# Patient Record
Sex: Female | Born: 1975
Health system: Southern US, Community
[De-identification: ages and names within clinical notes are randomized; demographics above are authoritative.]

---

## 2006-09-16 ENCOUNTER — Ambulatory Visit: Payer: Self-pay | Admitting: Gastroenterology

## 2006-09-16 LAB — CONVERTED CEMR LAB
ALT: 14 units/L (ref 0–40)
Albumin: 4 g/dL (ref 3.5–5.2)
Basophils Absolute: 0 10*3/uL (ref 0.0–0.1)
Calcium: 9.4 mg/dL (ref 8.4–10.5)
Creatinine, Ser: 0.6 mg/dL (ref 0.4–1.2)
Glomerular Filtration Rate, Af Am: 152 mL/min/{1.73_m2}
HCT: 38.9 % (ref 36.0–46.0)
MCHC: 33.9 g/dL (ref 30.0–36.0)
MCV: 88.4 fL (ref 78.0–100.0)
Monocytes Relative: 10.1 % (ref 3.0–11.0)
RDW: 13.7 % (ref 11.5–14.6)
Sodium: 136 meq/L (ref 135–145)
T4, Total: 7.9 ug/dL (ref 5.0–12.5)
Total Bilirubin: 0.7 mg/dL (ref 0.3–1.2)
Total Protein: 7 g/dL (ref 6.0–8.3)
WBC: 6.5 10*3/uL (ref 4.5–10.5)

## 2007-05-11 ENCOUNTER — Inpatient Hospital Stay (HOSPITAL_COMMUNITY): Admission: AD | Admit: 2007-05-11 | Discharge: 2007-05-13 | Payer: Self-pay | Admitting: Obstetrics & Gynecology

## 2009-08-12 ENCOUNTER — Ambulatory Visit: Payer: Self-pay | Admitting: Internal Medicine

## 2009-08-12 DIAGNOSIS — M5412 Radiculopathy, cervical region: Secondary | ICD-10-CM | POA: Insufficient documentation

## 2009-08-12 DIAGNOSIS — M542 Cervicalgia: Secondary | ICD-10-CM | POA: Insufficient documentation

## 2009-08-17 ENCOUNTER — Ambulatory Visit (HOSPITAL_COMMUNITY): Admission: RE | Admit: 2009-08-17 | Discharge: 2009-08-17 | Payer: Self-pay | Admitting: Internal Medicine

## 2009-08-19 ENCOUNTER — Encounter: Payer: Self-pay | Admitting: Internal Medicine

## 2009-08-19 ENCOUNTER — Telehealth: Payer: Self-pay | Admitting: Internal Medicine

## 2009-08-19 DIAGNOSIS — M4802 Spinal stenosis, cervical region: Secondary | ICD-10-CM | POA: Insufficient documentation

## 2009-08-25 ENCOUNTER — Telehealth: Payer: Self-pay | Admitting: Internal Medicine

## 2009-09-09 ENCOUNTER — Telehealth: Payer: Self-pay | Admitting: Internal Medicine

## 2009-12-30 ENCOUNTER — Ambulatory Visit: Payer: Self-pay | Admitting: Internal Medicine

## 2009-12-30 DIAGNOSIS — H6120 Impacted cerumen, unspecified ear: Secondary | ICD-10-CM | POA: Insufficient documentation

## 2009-12-30 DIAGNOSIS — H68109 Unspecified obstruction of Eustachian tube, unspecified ear: Secondary | ICD-10-CM | POA: Insufficient documentation

## 2009-12-30 DIAGNOSIS — H6500 Acute serous otitis media, unspecified ear: Secondary | ICD-10-CM | POA: Insufficient documentation

## 2010-01-31 ENCOUNTER — Telehealth: Payer: Self-pay | Admitting: Internal Medicine

## 2010-02-16 ENCOUNTER — Encounter: Payer: Self-pay | Admitting: Internal Medicine

## 2010-03-14 ENCOUNTER — Telehealth: Payer: Self-pay | Admitting: Nurse Practitioner

## 2010-03-15 ENCOUNTER — Ambulatory Visit: Payer: Self-pay | Admitting: Gastroenterology

## 2010-03-15 DIAGNOSIS — R110 Nausea: Secondary | ICD-10-CM | POA: Insufficient documentation

## 2010-03-15 DIAGNOSIS — J029 Acute pharyngitis, unspecified: Secondary | ICD-10-CM | POA: Insufficient documentation

## 2010-03-15 DIAGNOSIS — R1011 Right upper quadrant pain: Secondary | ICD-10-CM | POA: Insufficient documentation

## 2010-03-16 LAB — CONVERTED CEMR LAB
ALT: 15 units/L (ref 0–35)
AST: 20 units/L (ref 0–37)
Albumin: 4.3 g/dL (ref 3.5–5.2)
Basophils Absolute: 0 10*3/uL (ref 0.0–0.1)
CO2: 25 meq/L (ref 19–32)
Chloride: 107 meq/L (ref 96–112)
Eosinophils Absolute: 0.1 10*3/uL (ref 0.0–0.7)
Eosinophils Relative: 0.9 % (ref 0.0–5.0)
GFR calc non Af Amer: 111.29 mL/min (ref >60)
HCT: 30.8 % — ABNORMAL LOW (ref 36.0–46.0)
Lymphocytes Relative: 31.2 % (ref 12.0–46.0)
Lymphs Abs: 2.4 10*3/uL (ref 0.7–4.0)
MCV: 77.4 fL — ABNORMAL LOW (ref 78.0–100.0)
Monocytes Absolute: 0.7 10*3/uL (ref 0.1–1.0)
Monocytes Relative: 9.6 % (ref 3.0–12.0)
Neutro Abs: 4.5 10*3/uL (ref 1.4–7.7)
Neutrophils Relative %: 57.9 % (ref 43.0–77.0)
Platelets: 329 10*3/uL (ref 150.0–400.0)
RDW: 15.2 % — ABNORMAL HIGH (ref 11.5–14.6)
TSH: 1.39 microintl units/mL (ref 0.35–5.50)

## 2010-03-21 ENCOUNTER — Ambulatory Visit: Payer: Self-pay | Admitting: Nurse Practitioner

## 2010-03-21 ENCOUNTER — Ambulatory Visit (HOSPITAL_COMMUNITY): Admission: RE | Admit: 2010-03-21 | Discharge: 2010-03-21 | Payer: Self-pay | Admitting: Gastroenterology

## 2010-03-29 LAB — CONVERTED CEMR LAB
Ferritin: 2.1 ng/mL — ABNORMAL LOW (ref 10.0–291.0)
Folate: 13.2 ng/mL
Saturation Ratios: 4.7 % — ABNORMAL LOW (ref 20.0–50.0)
Vitamin B-12: 225 pg/mL (ref 211–911)

## 2010-10-03 NOTE — Progress Notes (Signed)
Summary: REFERRAL  Phone Note Call from Patient Call back at Home Phone [phone] Call back at [phone]   Summary of Call: Patient is requesting a call.  Initial call taken by: Lamar Sprinkles, CMA,  September 09, 2009 2:38 PM  Follow-up for Phone Call        Pt informed that we are waiting on referral time Follow-up by: Lamar Sprinkles, CMA,  September 09, 2009 5:15 PM

## 2010-10-03 NOTE — Consult Note (Signed)
Summary: [facility]  [facility]   Imported By: Sherian Rein 02/20/2010 07:33:51  _____________________________________________________________________  External Attachment:    Type:   Image     Comment:   External Document

## 2010-10-03 NOTE — Assessment & Plan Note (Signed)
Summary: EAR IS STOPPED UP/NWS   Vital Signs:  Patient profile:   35 year old female Menstrual status:  regular Height:      65 inches Weight:      151 pounds BMI:     25.22 O2 Sat:      99 % on Room air Temp:     98.3 degrees F oral Pulse rate:   86 / minute Pulse rhythm:   regular Resp:     16 per minute BP sitting:   134 / 90  (left arm) Cuff size:   large  Vitals Entered By: Rock Nephew CMA (December 30, 2009 8:39 AM)  Nutrition Counseling: Patient's BMI is greater than 25 and therefore counseled on weight management options.  O2 Flow:  Room air CC: L side ear congestion x 3wks Is Patient Diabetic? No Pain Assessment Patient in pain? no        Primary Care Provider:  Etta Grandchild MD  CC:  L side ear congestion x 3wks.  History of Present Illness: She returns c/o 3 week hx. of left LOH, fullnees in left ear, and "static" sensation in left ear.  Preventive Screening-Counseling & Management  Alcohol-Tobacco     Alcohol drinks/day: 0     Smoking Status: never  Hep-HIV-STD-Contraception     Hepatitis Risk: no risk noted     HIV Risk: no risk noted     STD Risk: no risk noted      Sexual History:  currently monogamous.        Drug Use:  never.        Blood Transfusions:  no.    Medications Prior to Update: 1)  Birth Control Pill 2)  Ibuprofen 800 Mg Tabs (Ibuprofen) .... One By Mouth Three Times A Day As Needed For Pain  Current Medications (verified): 1)  Ibuprofen 800 Mg Tabs (Ibuprofen) .... One By Mouth Three Times A Day As Needed For Pain 2)  Ceftin 500 Mg Tab (Cefuroxime Axetil) .... Take One (1) Tablet By Mouth Two (2) Times A Day X 10 Days  Allergies (verified): No Known Drug Allergies  Past History:  Past Medical History: Reviewed history from 08/12/2009 and no changes required. Unremarkable  Past Surgical History: Reviewed history from 08/12/2009 and no changes required. Denies surgical history  Family History: Reviewed history  from 08/12/2009 and no changes required. Family History of Alcoholism/Addiction Family History of Arthritis Family History High cholesterol Family History Hypertension Family History Kidney disease Family History of Stroke F 1st degree relative <60  Social History: Reviewed history from 08/12/2009 and no changes required. Occupation: Editor, commissioning GI endoscopy Married Never Smoked Alcohol use-no Drug use-no Regular exercise-no Hepatitis Risk:  no risk noted HIV Risk:  no risk noted STD Risk:  no risk noted Drug Use:  never  Review of Systems  The patient denies anorexia, fever, chest pain, prolonged cough, abdominal pain, suspicious skin lesions, enlarged lymph nodes, and angioedema.   ENT:  Complains of decreased hearing, earache, and ringing in ears; denies difficulty swallowing, ear discharge, hoarseness, nasal congestion, nosebleeds, postnasal drainage, sinus pressure, and sore throat.  Physical Exam  General:  Well-developed, well-nourished, in no acute distress; alert and oriented x 3.   Head:  normocephalic, atraumatic, no abnormalities observed, and no abnormalities palpated.   Ears:  R ear normal and L Cerumen impaction.   Nose:  External nasal examination shows no deformity or inflammation. Nasal mucosa are pink and moist without lesions or exudates. Mouth:  Oral mucosa and oropharynx without lesions or exudates.  Teeth in good repair. Neck:  No deformities, masses, or tenderness noted.no cervical lymphadenopathy.   Lungs:  Normal respiratory effort, chest expands symmetrically. Lungs are clear to auscultation, no crackles or wheezes. Heart:  Normal rate and regular rhythm. S1 and S2 normal without gallop, murmur, click, rub or other extra sounds. Abdomen:  Bowel sounds positive,abdomen soft and non-tender without masses, organomegaly or hernias noted. Msk:  No deformity or scoliosis noted of thoracic or lumbar spine.   Pulses:  R and L carotid,radial,femoral,dorsalis  pedis and posterior tibial pulses are full and equal bilaterally Extremities:  No clubbing, cyanosis, edema, or deformity noted with normal full range of motion of all joints.   Neurologic:  No cranial nerve deficits noted. Station and gait are normal. Plantar reflexes are down-going bilaterally. DTRs are symmetrical throughout. Sensory, motor and coordinative functions appear intact. Skin:  turgor normal, color normal, no rashes, no suspicious lesions, no ecchymoses, no petechiae, no purpura, no ulcerations, and no edema.   Axillary Nodes:  no R axillary adenopathy and no L axillary adenopathy.   Psych:  Cognition and judgment appear intact. Alert and cooperative with normal attention span and concentration. No apparent delusions, illusions, hallucinations Additional Exam:  colace liquid was placed in the left ear and then it was irrigated. a large amount of wax was removed. after that the ear exam showed a TM that is dull and retracted. there is a serous effusion behind the TM. the TM is fully intact.   Impression & Recommendations:  Problem # 1:  CERUMEN IMPACTION, LEFT (ICD-380.4) Assessment New  left ear was irrigated with colace and water  Orders: Cerumen Impaction Removal (95638)  Problem # 2:  UNSPECIFIED OBSTRUCTION OF EUSTACHIAN TUBE (ICD-381.60) Assessment: New try depo medrol IM to decrease swelling and pressure in left eust. tube  Problem # 3:  ACUTE SEROUS OTITIS MEDIA (ICD-381.01) Assessment: New  start ceftin and try zyrtec-d  Orders: Admin of Therapeutic Inj  intramuscular or subcutaneous (75643) Depo- Medrol 40mg  (J1030) Depo- Medrol 80mg  (J1040)  Complete Medication List: 1)  Ibuprofen 800 Mg Tabs (Ibuprofen) .... One by mouth three times a day as needed for pain 2)  Ceftin 500 Mg Tab (Cefuroxime axetil) .... Take one (1) tablet by mouth two (2) times a day x 10 days  Patient Instructions: 1)  Please schedule a follow-up appointment in 2 weeks. 2)  Get plenty  of rest, drink lots of clear liquids, and use Tylenol or Ibuprofen for fever and comfort. Return in 7-10 days if you're not better:sooner if you're feeling worse. 3)  Take your antibiotic as prescribed until ALL of it is gone, but stop if you develop a rash or swelling and contact our office as soon as possible. Prescriptions: CEFTIN 500 MG TAB (CEFUROXIME AXETIL) Take one (1) tablet by mouth two (2) times a day X 10 days  #20 x 0   Entered and Authorized by:   Etta Grandchild MD   Signed by:   Etta Grandchild MD on 12/30/2009   Method used:   Electronically to        [facility] [address] * (retail)       [address]       [place], [place]  [address]       Ph: [phone]       Fax: [phone]   RxID:   [id number]    Medication Administration  Injection #  1:    Medication: Depo- Medrol 80mg     Diagnosis: ACUTE SEROUS OTITIS MEDIA (ICD-381.01)    Route: IM    Site: R deltoid    Exp Date: 07/2012    Lot #: [id number]    Mfr: pfizer    Patient tolerated injection without complications    Given by: Rock Nephew CMA (December 30, 2009 9:43 AM)  Injection # 2:    Medication: Depo- Medrol 40mg     Diagnosis: ACUTE SEROUS OTITIS MEDIA (ICD-381.01)    Route: IM    Site: R deltoid    Exp Date: 07/2012    Lot #: [id number]    Mfr: pfizer    Patient tolerated injection without complications    Given by: Rock Nephew CMA (December 30, 2009 9:43 AM)  Orders Added: 1)  Admin of Therapeutic Inj  intramuscular or subcutaneous [96372] 2)  Depo- Medrol 40mg  [J1030] 3)  Depo- Medrol 80mg  [J1040] 4)  Cerumen Impaction Removal [69210] 5)  Est. Patient Level IV [60454]

## 2010-10-03 NOTE — Progress Notes (Signed)
Summary: Appt to see extender, Willette Cluster NP  Phone Note Outgoing Call   Call placed by: Joselyn Glassman,  March 14, 2010 10:47 AM Call placed to: Patient Summary of Call: Called Wynell and Lm for her on her home phone.  Asked her to call me back. I did mention to her Gunnar Fusi has openings today and the rest of the week.   Initial call taken by: Joselyn Glassman,  March 14, 2010 10:47 AM  Follow-up for Phone Call        Made appt with Willette Cluster NP for tomorrow , 03-15-10 at 2 PM for [place]. Follow-up by: Joselyn Glassman,  March 14, 2010 4:12 PM

## 2010-10-03 NOTE — Progress Notes (Signed)
Summary: REFERRAL  Phone Note Call from Patient Call back at [phone] OR [phone]   Summary of Call: Patient is requesting referral to ENT, continues to c/o ear trouble. Please refer to [facility].  Initial call taken by: Lamar Sprinkles, CMA,  Jan 31, 2010 2:26 PM

## 2010-10-03 NOTE — Assessment & Plan Note (Signed)
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 She is here for initial evaluation of several issues:   Sore throat Patient complains of a persistent sore throat and concerned it may from acid reflux. She has no typical GERD symptoms. No odynophagia. She did take antibiotics in May, none since. No thrush. No allergies or sinus drainage. At times she feels like something is in her throat. No history of dysphagia.  Pain between shoulder blades.  Sometimes notices pain in between upper shoulder blades. Pain not related to meals. Though she has intermittent nausea and has had a couple of episodes of RUQ pain, the shoulder blade pain has been unrelated.   RUQ pain Two episodes of sharp RUQ pain, both transient in nature. At time of one episode patient was just standing in her kitchen.   Nausea / bloating Intermittent post-prandial bloating and nausea without vomiting. Not pregnant, husband had vasectomy.  Takes Ibuprofen but only once a month.    GI Review of Systems    Reports abdominal pain and  nausea.     Location of  Abdominal pain: RUQ.    Denies acid reflux, belching, bloating, chest pain, dysphagia with liquids, dysphagia with solids, heartburn, loss of appetite, vomiting, vomiting blood, weight loss, and  weight gain.        Denies anal fissure, black tarry stools, change in bowel habit, constipation, diarrhea, diverticulosis, fecal incontinence, heme positive stool, hemorrhoids, irritable bowel syndrome, jaundice, light color stool, liver problems, rectal bleeding, and  rectal pain.   Current Medications (verified): 1)  Ibuprofen 800 Mg Tabs (Ibuprofen) .... One By Mouth Three Times A Day  As Needed For Pain  Allergies (verified): No Known Drug Allergies  Past History:  Past Medical History: Reviewed history from 08/12/2009 and no changes required. Unremarkable  Past Surgical History: Reviewed history from 08/12/2009 and no changes required. Denies surgical history  Family History: Reviewed history from 08/12/2009 and no changes required. Family History of Alcoholism/Addiction Family History of Arthritis Family History High cholesterol Family History Hypertension Family History Kidney disease Family History of Stroke F 1st degree relative <60  Social History: Reviewed history from 08/12/2009 and no changes required. Occupation: Editor, commissioning GI endoscopy Married Never Smoked Alcohol use-no Drug use-no Regular exercise-no  Review of Systems       The patient complains of back pain, fatigue, and sore throat.  The patient denies allergy/sinus, anemia, anxiety-new, arthritis/joint pain, blood in urine, breast changes/lumps, change in vision, confusion, cough, coughing up blood, depression-new, fainting, fever, headaches-new, hearing problems, heart murmur, heart rhythm changes, itching, menstrual pain, muscle pains/cramps, night sweats, nosebleeds, pregnancy symptoms, shortness of breath, skin rash, sleeping problems, swelling of feet/legs, swollen lymph glands, thirst - excessive , urination - excessive , urination changes/pain, urine leakage, vision changes, and voice change.    Vital Signs:  Patient profile:   35 year old female Menstrual status:  regular Height:      65 inches Weight:      154.13 pounds BMI:     25.74 Pulse rate:   68 / minute Pulse rhythm:   regular BP sitting:   124 / 78  (left arm) Cuff size:   regular  Vitals Entered By:  June McMurray CMA Duncan Dull) (March 15, 2010 1:56 PM)  Physical Exam  General:  Well developed, well nourished, no acute distress. Eyes:  Conjunctiva pink, no icterus.  Mouth:  No oral lesions. Tongue moist.  Neck:   no obvious masses  Lungs:  Clear throughout to auscultation. Heart:  Regular rate and rhythm; no murmurs, rubs,  or bruits. Abdomen:  Abdomen soft, nontender, nondistended. No obvious masses or hepatomegaly.Normal bowel sounds.  Msk:  Symmetrical with no gross deformities. Normal posture. Extremities:  No palmar erythema, no edema.  Neurologic:  Alert and  oriented x4;  grossly normal neurologically. Skin:  Intact without significant lesions or rashes. Cervical Nodes:  No significant cervical adenopathy. Psych:  Alert and cooperative. Normal mood and affect.   Impression & Recommendations:  Problem # 1:  SORE THROAT (ICD-462) Assessment New Her persistent sore throat may be secondary to acid reflux but this isn't a common manifestation. She has no odynophagia to suggest candida esophagitis and no oral plaque on exam either. Perhaps her pain is secondary to pharyngitis (?etiology) but will give her a trial of a PPI to see if it will help. Orders: TLB-CBC Platelet - w/Differential (85025-CBCD) TLB-CMP (Comprehensive Metabolic Pnl) (80053-COMP) TLB-TSH (Thyroid Stimulating Hormone) (84443-TSH)  Problem # 2:  NAUSEA (ICD-787.02) Assessment: New Intermittent, post-prandial. Differentials include, but are not limited to, gastritis, PUD, idiopathic gastroparesis, biliary disease. Because of nausea and recent RUQ discomfort will check U/S abdomen. Will also check some basic labs as it has been several years since her last bloodwork and she has several issues going on. Maybe a PPI will help. If all tests negative and PPI doesn't help, recommend EGD.  Orders: Ultrasound Abdomen (UAS) TLB-CBC Platelet - w/Differential (85025-CBCD) TLB-CMP (Comprehensive Metabolic Pnl) (80053-COMP) TLB-TSH (Thyroid Stimulating Hormone) (84443-TSH)  Problem # 3:  ABDOMINAL PAIN-RUQ (ICD-789.01) Assessment: New Two episodes of transient RUQ pain. Not highly suspicious of biliary disease but given nausea and  upper shoulder blade pain will proceed with U/S and labs.  Patient will be called with test results and any further recommendations based on those results.  Orders: Ultrasound Abdomen (UAS) TLB-CBC Platelet - w/Differential (85025-CBCD) TLB-CMP (Comprehensive Metabolic Pnl) (80053-COMP) TLB-TSH (Thyroid Stimulating Hormone) (84443-TSH)  Patient Instructions: 1)  Please go to lab, basement level. 2)  We have given you sampels of Nexium 40 MG.  Take 1 capsule 30 min prior to breakfast daily. 3)  We have scheduled the Abdominal Ultrasound for Tues 03-21-10.   4)  Directions provided. 5)  We will call you with the labs and [place] results.  6)  Copy sent to : Etta Grandchild, MD 7)  The medication list was reviewed and reconciled.  All changed / newly prescribed medications were explained.  A complete medication list was provided to the patient / caregiver.

## 2010-12-11 ENCOUNTER — Other Ambulatory Visit: Payer: Self-pay | Admitting: Internal Medicine

## 2010-12-11 ENCOUNTER — Ambulatory Visit (INDEPENDENT_AMBULATORY_CARE_PROVIDER_SITE_OTHER): Payer: BLUE CROSS/BLUE SHIELD | Admitting: Internal Medicine

## 2010-12-11 ENCOUNTER — Encounter: Payer: Self-pay | Admitting: Internal Medicine

## 2010-12-11 VITALS — BP 102/78 | HR 109 | Temp 98.1°F | Resp 14 | Ht 65.0 in | Wt 153.8 lb

## 2010-12-11 DIAGNOSIS — N12 Tubulo-interstitial nephritis, not specified as acute or chronic: Secondary | ICD-10-CM | POA: Insufficient documentation

## 2010-12-11 MED ORDER — NITROFURANTOIN MONOHYD MACRO 100 MG PO CAPS: 100.0000 mg | ORAL_CAPSULE | Freq: Two times a day (BID) | ORAL | Status: AC

## 2010-12-11 MED ORDER — CEFTRIAXONE SODIUM 500 MG IJ SOLR
500.0000 mg | Freq: Once | INTRAMUSCULAR | Status: AC
  Administered 2010-12-11: 500 mg via INTRAMUSCULAR

## 2010-12-11 MED ORDER — PROMETHAZINE HCL 25 MG/ML IJ SOLN
25.0000 mg | Freq: Once | INTRAMUSCULAR | Status: AC
  Administered 2010-12-11: 25 mg via INTRAMUSCULAR

## 2010-12-11 NOTE — Progress Notes (Signed)
Subjective:    Sydney Ware is a 35 y.o. female who was self-referred for evaluation and treatment of gross hematuria. Onset of hematuria was 3 days ago and was sudden in onset. There is not a history of nephrolithiasis. There is not a history of urologic trauma. Other urologic symptoms include hesitancy, nocturia, urgency, dysuria. Patient admits to history of no risk factors for cancer. Patient denies history of Agent Orange exposure, chronic Foley catheter, GU surgeries, occupational exposure, sexually transmitted diseases, tobacco use, trauma and urolithiasis. Prior workup has been none. The following portions of the patient's history were reviewed and updated as appropriate: allergies, current medications, past family history, past medical history, past social history, past surgical history and problem list.  Review of Systems Constitutional: positive for chills and fatigue, negative for anorexia, fevers, malaise, night sweats, sweats and weight loss Genitourinary:positive for dysuria, frequency, hematuria, hesitancy and nocturia, negative for urinary incontinence    Objective:    BP 102/78  Pulse 109  Temp(Src) 98.1 F (36.7 C) (Oral)  Resp 14  Ht 5\' 5"  (1.651 m)  Wt 153 lb 12 oz (69.741 kg)  BMI 25.59 kg/m2  SpO2 97% General appearance: alert, cooperative and mild distress Eyes: conjunctivae/corneas clear. PERRL, EOM's intact. Fundi benign. Back: symmetric, no curvature. ROM normal. No CVA tenderness. Lungs: clear to auscultation bilaterally Heart: regular rate and rhythm, S1, S2 normal, no murmur, click, rub or gallop Abdomen: soft, non-tender; bowel sounds normal; no masses,  no organomegaly Extremities: extremities normal, atraumatic, no cyanosis or edema  Lab Review Urine analysis shows +leukocytes, +nitrites, +blood Micro exam: wbc- 10-25 Lab Results  Component Value Date   WBC 7.8 03/15/2010   HGB 10.4* 03/15/2010   HCT 30.8* 03/15/2010   MCV 77.4* 03/15/2010   PLT 329.0  03/15/2010   Lab Results  Component Value Date   CREATININE 0.7 03/15/2010   BUN 10 03/15/2010      Assessment:    pyelonephritis hematuria    Plan:    urine culture, rocephin and phenergan in the office today, then start macrobid, she was given pt educ material, will adjust antibiotic if needed based on urine culture result

## 2010-12-11 NOTE — Patient Instructions (Signed)
Pyelonephritis   (Kidney Infection)  In general there are 2 main types of kidney infections:   Infections that come on quickly without any warning (acute pyelonephritis and acute kidney infection).    Infection that persist for long periods of time (chronic pyelonephritis and chronic kidney infection).     CAUSES  Two main causes of kidney infection are:   Bacteria traveling from the bladder to the kidney. This is a problem especially with pregnant women. The urine in the bladder can become filled with bacteria from multiple causes including:    Prostatitis.    Sexual intercourse in females.    Routine bladder infection also called cystitis.    Bacteria traveling from the blood stream to the tissue part of the kidney.   Problems that may increase the risk of getting a kidney infection include:   Diabetes.    Kidney stones or bladder stones.    Cancer.    Catheters placed in the bladder.    Other abnormalities of the kidney or ureter.   SYMPTOMS   Abdominal pain.    Pain in the side or flank area.    Fever.    Chills.    Upset stomach.    Blood in the urine (dark urine).    Frequent urination.    Strong or persistent urge to urinate.    Burning or stinging when urinating.   The last three symptoms may occur first and may represent symptoms of the bladder infection which may occur prior to the kidney infection.  TREATMENT  In general the treatment depends on how severe the infection is.    If it is mild and caught early the doctor may treat you with oral antibiotics and send you home.    If the infection is more severe the bacteria may have gotten into the bloodstream. This will require intravenous antibiotics and a hospital stay. Symptoms may include:    High fever.    Severe flank pain.    Shaking chills.    Even after a hospital stay the doctor may require you to be on oral antibiotics for a period of time.    Other treatments may be required depending upon the cause of the infection.    HOME CARE INSTRUCTIONS   Take all of the antibiotics given to you by the doctor    Set up appointment to have the urine checked to make sure the infection is gone    Drink plenty of fluids    The doctor may also supply medicines for the bladder if you have urgency and frequency of urination.   SEEK IMMEDIATE MEDICAL CARE IF:   You develop a very high fever over 103 F (39.4 C).    You are unable to take the antibiotics or fluids.    If you develop shaking chills.    You experience extreme weakness or fainting.    There is no improvement after 2 days of treatment.   Document Released: 08/20/2005 Document Re-Released: 11/14/2009  [employer] Patient Information 2011 [employer].

## 2010-12-14 LAB — CULTURE, URINE COMPREHENSIVE: Colony Count: 25000

## 2010-12-18 ENCOUNTER — Telehealth: Payer: Self-pay | Admitting: *Deleted

## 2010-12-18 NOTE — Telephone Encounter (Signed)
Patient requesting results of U/A.

## 2010-12-18 NOTE — Telephone Encounter (Signed)
Returned call to patient and advised per MD. She would like to know what MD think about her kidneys (concerns, etc..)

## 2010-12-18 NOTE — Telephone Encounter (Signed)
LA, please tell her that the urine culture has some contamination but was neg for a UTI

## 2010-12-18 NOTE — Telephone Encounter (Signed)
She needs to get a CT done to look for stones

## 2010-12-19 NOTE — Telephone Encounter (Signed)
Order corrected, pt aware of CT

## 2010-12-22 ENCOUNTER — Other Ambulatory Visit: Payer: BLUE CROSS/BLUE SHIELD

## 2010-12-22 ENCOUNTER — Ambulatory Visit (INDEPENDENT_AMBULATORY_CARE_PROVIDER_SITE_OTHER)
Admission: RE | Admit: 2010-12-22 | Discharge: 2010-12-22 | Disposition: A | Discharge status: Home / Self Care | Payer: BLUE CROSS/BLUE SHIELD | Source: Ambulatory Visit | Attending: Internal Medicine | Admitting: Internal Medicine

## 2010-12-22 DIAGNOSIS — R319 Hematuria, unspecified: Secondary | ICD-10-CM

## 2010-12-28 ENCOUNTER — Encounter: Payer: Self-pay | Admitting: Internal Medicine

## 2011-06-15 LAB — CBC
HCT: 29.6 — ABNORMAL LOW
MCHC: 35.6
MCV: 88.1
Platelets: 294
Platelets: 303
RBC: 3.37 — ABNORMAL LOW
RBC: 4.16
RDW: 13.3

## 2011-09-13 ENCOUNTER — Other Ambulatory Visit: Payer: Self-pay | Admitting: Obstetrics and Gynecology

## 2011-09-13 DIAGNOSIS — Z1231 Encounter for screening mammogram for malignant neoplasm of breast: Secondary | ICD-10-CM

## 2011-10-17 ENCOUNTER — Ambulatory Visit
Admission: RE | Admit: 2011-10-17 | Discharge: 2011-10-17 | Disposition: A | Discharge status: Home / Self Care | Payer: BC Managed Care – PPO | Source: Ambulatory Visit | Attending: Obstetrics and Gynecology | Admitting: Obstetrics and Gynecology

## 2011-10-17 DIAGNOSIS — Z1231 Encounter for screening mammogram for malignant neoplasm of breast: Secondary | ICD-10-CM

## 2012-06-18 ENCOUNTER — Encounter: Payer: Self-pay | Admitting: Internal Medicine

## 2012-06-18 ENCOUNTER — Ambulatory Visit (INDEPENDENT_AMBULATORY_CARE_PROVIDER_SITE_OTHER): Payer: BC Managed Care – PPO | Admitting: Internal Medicine

## 2012-06-18 VITALS — BP 118/80 | HR 80 | Temp 98.9°F | Resp 16 | Ht 65.0 in | Wt 156.0 lb

## 2012-06-18 DIAGNOSIS — J020 Streptococcal pharyngitis: Secondary | ICD-10-CM

## 2012-06-18 MED ORDER — AZITHROMYCIN 500 MG PO TABS: 500.0000 mg | ORAL_TABLET | Freq: Every day | ORAL | Status: DC

## 2012-06-18 NOTE — Patient Instructions (Signed)

## 2012-06-20 ENCOUNTER — Encounter: Payer: Self-pay | Admitting: Internal Medicine

## 2012-06-20 NOTE — Progress Notes (Signed)
Subjective:    Patient ID: Sydney Ware, female    DOB: 11-Jan-1976, 36 y.o.   MRN: [id number]  Sore Throat  This is a new problem. The current episode started in the past 7 days. The problem has been gradually worsening. Neither side of throat is experiencing more pain than the other. The maximum temperature recorded prior to her arrival was 100 - 100.9 F. The fever has been present for 3 to 4 days. The pain is at a severity of 0/10. The patient is experiencing no pain. Associated symptoms include swollen glands. Pertinent negatives include no abdominal pain, congestion, coughing, diarrhea, drooling, ear discharge, ear pain, headaches, hoarse voice, plugged ear sensation, neck pain, shortness of breath, stridor, trouble swallowing or vomiting. She has had exposure to strep. She has had no exposure to mono. She has tried nothing for the symptoms.      Review of Systems  Constitutional: Positive for fever, chills and fatigue. Negative for diaphoresis, activity change, appetite change and unexpected weight change.  HENT: Positive for sore throat. Negative for ear pain, congestion, hoarse voice, facial swelling, drooling, mouth sores, trouble swallowing, neck pain and ear discharge.   Eyes: Negative.   Respiratory: Negative.  Negative for cough, shortness of breath and stridor.   Cardiovascular: Negative.   Gastrointestinal: Negative.  Negative for vomiting, abdominal pain and diarrhea.  Genitourinary: Negative.   Musculoskeletal: Negative for myalgias, back pain, joint swelling, arthralgias and gait problem.  Skin: Negative.   Neurological: Negative.  Negative for headaches.  Hematological: Positive for adenopathy. Does not bruise/bleed easily.  Psychiatric/Behavioral: Negative.        Objective:   Physical Exam  Vitals reviewed. Constitutional: She is oriented to person, place, and time. She appears well-developed and well-nourished.  Non-toxic appearance. She does not have a sickly  appearance. She does not appear ill. No distress.  HENT:  Head: No trismus in the jaw.  Right Ear: Hearing, tympanic membrane, external ear and ear canal normal.  Left Ear: Hearing, tympanic membrane, external ear and ear canal normal.  Nose: No mucosal edema or rhinorrhea. Right sinus exhibits no maxillary sinus tenderness and no frontal sinus tenderness. Left sinus exhibits no maxillary sinus tenderness and no frontal sinus tenderness.  Mouth/Throat: Mucous membranes are normal. Mucous membranes are not pale, not dry and not cyanotic. No oral lesions. No uvula swelling. Posterior oropharyngeal erythema present. No oropharyngeal exudate, posterior oropharyngeal edema or tonsillar abscesses.  Eyes: Conjunctivae normal are normal. Right eye exhibits no discharge. Left eye exhibits no discharge. No scleral icterus.  Neck: Normal range of motion. Neck supple. No JVD present. No tracheal deviation present. No thyromegaly present.  Cardiovascular: Normal rate, regular rhythm, normal heart sounds and intact distal pulses.  Exam reveals no gallop and no friction rub.   No murmur heard. Pulmonary/Chest: Effort normal and breath sounds normal. No stridor. No respiratory distress. She has no wheezes. She has no rales. She exhibits no tenderness.  Abdominal: Soft. Bowel sounds are normal. She exhibits no distension and no mass. There is no tenderness. There is no rebound and no guarding.  Musculoskeletal: Normal range of motion. She exhibits no edema and no tenderness.  Lymphadenopathy:       Head (right side): No submental, no submandibular, no tonsillar, no preauricular, no posterior auricular and no occipital adenopathy present.       Head (left side): No submental, no submandibular, no tonsillar, no preauricular, no posterior auricular and no occipital adenopathy present.    She  has cervical adenopathy.       Right cervical: Superficial cervical adenopathy present. No deep cervical and no posterior  cervical adenopathy present.      Left cervical: Superficial cervical adenopathy present. No deep cervical and no posterior cervical adenopathy present.    She has no axillary adenopathy.       Right: No inguinal, no supraclavicular and no epitrochlear adenopathy present.       Left: No inguinal, no supraclavicular and no epitrochlear adenopathy present.  Neurological: She is oriented to person, place, and time.  Skin: Skin is warm and dry. No rash noted. She is not diaphoretic. No erythema. No pallor.  Psychiatric: She has a normal mood and affect. Her behavior is normal. Judgment and thought content normal.          Assessment & Plan:

## 2012-06-20 NOTE — Assessment & Plan Note (Signed)
Start Zpak for the infection 

## 2013-06-29 ENCOUNTER — Ambulatory Visit (INDEPENDENT_AMBULATORY_CARE_PROVIDER_SITE_OTHER): Payer: BC Managed Care – PPO | Admitting: Internal Medicine

## 2013-06-29 ENCOUNTER — Encounter: Payer: Self-pay | Admitting: Internal Medicine

## 2013-06-29 VITALS — BP 122/84 | HR 86 | Temp 98.6°F | Ht 65.0 in | Wt 148.2 lb

## 2013-06-29 DIAGNOSIS — J029 Acute pharyngitis, unspecified: Secondary | ICD-10-CM

## 2013-06-29 DIAGNOSIS — J020 Streptococcal pharyngitis: Secondary | ICD-10-CM

## 2013-06-29 MED ORDER — PENICILLIN V POTASSIUM 250 MG PO TABS: 250.0000 mg | ORAL_TABLET | Freq: Two times a day (BID) | ORAL | Status: DC

## 2013-06-29 MED ORDER — PENICILLIN V POTASSIUM 500 MG PO TABS: 500.0000 mg | ORAL_TABLET | Freq: Two times a day (BID) | ORAL | Status: DC

## 2013-06-29 NOTE — Progress Notes (Signed)
HPI  Pt presents to the clinic today with c/o sore throat 4 days ago. She does reports pain with swallowing that has gotten worse over the last few days. She denies fever but has had chills and body aches. She has taken Ibuprofen and throat lozenges OTC. This has provided some relief. She denies having contact with people with similar symptoms but her child has recently had a cold. She did get treated for acute pharyngitis last year around this time.  Review of Systems     History reviewed. No pertinent past medical history.  Family History  Problem Relation Age of Onset  . Arthritis Mother   . Stroke Mother   . Alcohol abuse Father   . Hyperlipidemia Father   . Hypertension Father   . Kidney disease Father     History   Social History  . Marital Status: Married    Spouse Name: N/A    Number of Children: N/A  . Years of Education: N/A   Occupational History  . Not on file.   Social History Main Topics  . Smoking status: Never Smoker   . Smokeless tobacco: Never Used  . Alcohol Use: No  . Drug Use: No  . Sexual Activity: Yes    Birth Control/ Protection: None   Other Topics Concern  . Not on file   Social History Narrative  . No narrative on file    No Known Allergies   Constitutional: Denies headache, fatigue, fever or abrupt weight changes.  HEENT:  Positive sore throat. Denies eye redness, eye pain, pressure behind the eyes, facial pain, nasal congestion, ear pain, ringing in the ears, wax buildup, runny nose or bloody nose. Respiratory: Denies difficulty breathing or shortness of breath.  Cardiovascular: Denies chest pain, chest tightness, palpitations or swelling in the hands or feet.   No other specific complaints in a complete review of systems (except as listed in HPI above).  Objective:   BP 122/84  Pulse 86  Temp(Src) 98.6 F (37 C) (Oral)  Ht 5\' 5"  (1.651 m)  Wt 148 lb 4 oz (67.246 kg)  BMI 24.67 kg/m2  SpO2 99% Wt Readings from Last 3  Encounters:  06/29/13 148 lb 4 oz (67.246 kg)  06/18/12 156 lb (70.761 kg)  12/11/10 153 lb 12 oz (69.741 kg)     General: Appears her stated age, well developed, well nourished in NAD. HEENT: Head: normal shape and size; Eyes: sclera white, no icterus, conjunctiva pink, PERRLA and EOMs intact; Ears: Tm's gray and intact, normal light reflex; Nose: mucosa pink and moist, septum midline; Throat/Mouth: Teeth present, mucosa erythematous and moist, white exudate noted on right tonsillar pillar, no lesions or ulcerations noted.  Neck:. Neck supple, trachea midline. No massses, lumps or thyromegaly present.  Cardiovascular: Normal rate and rhythm. S1,S2 noted.  No murmur, rubs or gallops noted. No JVD or BLE edema. No carotid bruits noted. Pulmonary/Chest: Normal effort and positive vesicular breath sounds. No respiratory distress. No wheezes, rales or ronchi noted.      Assessment & Plan:   Acute strep pharyngitis:  RST-positive Get some rest and drink plenty of water Do salt water gargles for the sore throat eRx for Pen V 250 mg BID x 10 days  RTC as needed or if symptoms persist.

## 2013-06-29 NOTE — Patient Instructions (Signed)

## 2013-06-29 NOTE — Addendum Note (Signed)
Addended by: Bethann Punches E on: 06/29/2013 02:13 PM   Modules accepted: Orders

## 2013-06-29 NOTE — Progress Notes (Signed)
HPI: Pt presents to the office today with complaints of sore throat since last Thursday. Pt endorses pain with swallowing that has progressively gotten worse over the last few days with some chills today. Pt denies nasal congestion, nasal discharge, ear pain, cough, or chest congestion. Pt tried taking OTC Ibuprofen and lozenges, with some relief. Pt was diagnosed with similar episode last October and was treated with a Zpack.   History reviewed. No pertinent past medical history.  Current Outpatient Prescriptions  Medication Sig Dispense Refill  . penicillin v potassium (VEETID) 250 MG tablet Take 1 tablet (250 mg total) by mouth 2 (two) times daily.  20 tablet  0   No current facility-administered medications for this visit.    No Known Allergies  ROS:  Constitutional: Denies fever, malaise, fatigue, headache or abrupt weight changes.  HEENT:Denies eye pain, eye redness, ear pain, ringing in the ears, wax buildup, runny nose, nasal congestion, bloody nose. Respiratory: Denies difficulty breathing, shortness of breath, cough or sputum production.      No other specific complaints in a complete review of systems (except as listed in HPI above).  PE:  BP 122/84  Pulse 86  Temp(Src) 98.6 F (37 C) (Oral)  Ht 5\' 5"  (1.651 m)  Wt 148 lb 4 oz (67.246 kg)  BMI 24.67 kg/m2  SpO2 99% Wt Readings from Last 3 Encounters:  06/29/13 148 lb 4 oz (67.246 kg)  06/18/12 156 lb (70.761 kg)  12/11/10 153 lb 12 oz (69.741 kg)    General: Appears their stated age, well developed, well nourished in NAD. HEENT: Head: normal shape and size; Eyes: sclera white, no icterus, conjunctiva pink, PERRLA and EOMs intact; Ears: Tm's gray and intact, normal light reflex; Nose: mucosa pink and moist, septum midline; Throat/Mouth: Teeth present, mucosa pink and moist, Throat erythematous bilaterally, with mild white exudate to posterior palate and right tonsil, tonsils 1+ bilaterally, uvula swollen and  midline. Neck: Normal range of motion. Neck supple, trachea midline. No massses, lumps or thyromegaly present.  Pulmonary/Chest: Normal effort and positive vesicular breath sounds. No respiratory distress. No wheezes, rales or ronchi noted. Marland Kitchen Psychiatric: Mood and affect normal. Behavior is normal. Judgment and thought content normal.   Rapid Strep positive   Assessment and Plan: Acute Strep Pharyngitis: Rapid strep culture Prescribed Pen V 500mg  one tablet by mouth twice daily for 10days 20 quantity, no refills Continue Ibuprofen and lozenges as needed Recommend gargling with warm salt water Follow up in 3-5 days if symptoms do not improve or worsen  Sherlon Nied S, Student-NP

## 2013-07-17 ENCOUNTER — Telehealth: Payer: Self-pay | Admitting: *Deleted

## 2013-07-17 NOTE — Telephone Encounter (Signed)
She needs to be rechecked.

## 2013-07-17 NOTE — Telephone Encounter (Signed)
Pt called reports she still has sore throat and symptoms have not resolved even after taking Penicillin as directed.  Please advise

## 2013-07-17 NOTE — Telephone Encounter (Signed)
Spoke with pt, she states she is unable to come back in.  Please advise

## 2016-06-14 LAB — HM PAP SMEAR

## 2016-10-11 ENCOUNTER — Other Ambulatory Visit: Payer: Self-pay | Admitting: Obstetrics and Gynecology

## 2016-10-11 DIAGNOSIS — Z1231 Encounter for screening mammogram for malignant neoplasm of breast: Secondary | ICD-10-CM

## 2016-11-05 ENCOUNTER — Ambulatory Visit
Admission: RE | Admit: 2016-11-05 | Discharge: 2016-11-05 | Disposition: A | Discharge status: Home / Self Care | Payer: 59 | Source: Ambulatory Visit | Attending: Obstetrics and Gynecology | Admitting: Obstetrics and Gynecology

## 2016-11-05 ENCOUNTER — Other Ambulatory Visit: Payer: Self-pay | Admitting: Obstetrics and Gynecology

## 2016-11-05 DIAGNOSIS — Z1231 Encounter for screening mammogram for malignant neoplasm of breast: Secondary | ICD-10-CM

## 2017-09-24 ENCOUNTER — Other Ambulatory Visit: Payer: Self-pay | Admitting: Obstetrics and Gynecology

## 2017-09-24 DIAGNOSIS — Z1231 Encounter for screening mammogram for malignant neoplasm of breast: Secondary | ICD-10-CM

## 2017-11-11 ENCOUNTER — Ambulatory Visit
Admission: RE | Admit: 2017-11-11 | Discharge: 2017-11-11 | Disposition: A | Discharge status: Home / Self Care | Payer: 59 | Source: Ambulatory Visit | Attending: Obstetrics and Gynecology | Admitting: Obstetrics and Gynecology

## 2017-11-11 DIAGNOSIS — Z1231 Encounter for screening mammogram for malignant neoplasm of breast: Secondary | ICD-10-CM

## 2018-08-07 MED FILL — FLUCONAZOLE 150 MG TABS: 150 | 2 days supply | Qty: 2 | Fill #0

## 2018-10-09 ENCOUNTER — Other Ambulatory Visit: Payer: Self-pay | Admitting: Obstetrics and Gynecology

## 2018-10-09 DIAGNOSIS — Z1231 Encounter for screening mammogram for malignant neoplasm of breast: Secondary | ICD-10-CM

## 2018-11-17 ENCOUNTER — Ambulatory Visit
Admission: RE | Admit: 2018-11-17 | Discharge: 2018-11-17 | Disposition: A | Discharge status: Home / Self Care | Payer: 59 | Source: Ambulatory Visit | Attending: Obstetrics and Gynecology | Admitting: Obstetrics and Gynecology

## 2018-11-17 ENCOUNTER — Other Ambulatory Visit: Payer: Self-pay

## 2018-11-17 DIAGNOSIS — Z1231 Encounter for screening mammogram for malignant neoplasm of breast: Secondary | ICD-10-CM

## 2019-10-28 ENCOUNTER — Other Ambulatory Visit: Payer: Self-pay | Admitting: Obstetrics and Gynecology

## 2019-10-28 DIAGNOSIS — Z1231 Encounter for screening mammogram for malignant neoplasm of breast: Secondary | ICD-10-CM

## 2019-11-30 ENCOUNTER — Other Ambulatory Visit: Payer: Self-pay

## 2019-11-30 ENCOUNTER — Ambulatory Visit
Admission: RE | Admit: 2019-11-30 | Discharge: 2019-11-30 | Disposition: A | Discharge status: Home / Self Care | Payer: 59 | Source: Ambulatory Visit | Attending: Obstetrics and Gynecology | Admitting: Obstetrics and Gynecology

## 2019-11-30 DIAGNOSIS — Z1231 Encounter for screening mammogram for malignant neoplasm of breast: Secondary | ICD-10-CM

## 2020-11-03 ENCOUNTER — Other Ambulatory Visit: Payer: Self-pay | Admitting: Obstetrics and Gynecology

## 2020-11-03 DIAGNOSIS — Z1231 Encounter for screening mammogram for malignant neoplasm of breast: Secondary | ICD-10-CM

## 2020-12-06 ENCOUNTER — Other Ambulatory Visit: Payer: Self-pay

## 2020-12-06 ENCOUNTER — Ambulatory Visit
Admission: RE | Admit: 2020-12-06 | Discharge: 2020-12-06 | Disposition: A | Discharge status: Home / Self Care | Payer: 59 | Source: Ambulatory Visit | Attending: Obstetrics and Gynecology | Admitting: Obstetrics and Gynecology

## 2020-12-06 DIAGNOSIS — Z1231 Encounter for screening mammogram for malignant neoplasm of breast: Secondary | ICD-10-CM | POA: Diagnosis not present

## 2020-12-06 DIAGNOSIS — Z130 Encounter for screening for diseases of the blood and blood-forming organs and certain disorders involving the immune mechanism: Secondary | ICD-10-CM | POA: Diagnosis not present

## 2020-12-06 DIAGNOSIS — Z13220 Encounter for screening for lipoid disorders: Secondary | ICD-10-CM | POA: Diagnosis not present

## 2020-12-06 DIAGNOSIS — Z01419 Encounter for gynecological examination (general) (routine) without abnormal findings: Secondary | ICD-10-CM | POA: Diagnosis not present

## 2020-12-06 DIAGNOSIS — Z1329 Encounter for screening for other suspected endocrine disorder: Secondary | ICD-10-CM | POA: Diagnosis not present

## 2020-12-06 DIAGNOSIS — Z124 Encounter for screening for malignant neoplasm of cervix: Secondary | ICD-10-CM | POA: Diagnosis not present

## 2020-12-12 LAB — HM PAP SMEAR: HPV, high-risk: NEGATIVE

## 2021-01-11 DIAGNOSIS — N920 Excessive and frequent menstruation with regular cycle: Secondary | ICD-10-CM | POA: Diagnosis not present

## 2021-05-25 DIAGNOSIS — H18893 Other specified disorders of cornea, bilateral: Secondary | ICD-10-CM | POA: Diagnosis not present

## 2021-07-14 DIAGNOSIS — Z3202 Encounter for pregnancy test, result negative: Secondary | ICD-10-CM | POA: Diagnosis not present

## 2021-07-14 DIAGNOSIS — N920 Excessive and frequent menstruation with regular cycle: Secondary | ICD-10-CM | POA: Diagnosis not present

## 2021-07-16 ENCOUNTER — Inpatient Hospital Stay (HOSPITAL_COMMUNITY)
Admission: EM | Admit: 2021-07-16 | Discharge: 2021-07-19 | DRG: 863 | Disposition: A | Discharge status: Home / Self Care | Payer: 59 | Attending: Obstetrics and Gynecology | Admitting: Obstetrics and Gynecology

## 2021-07-16 ENCOUNTER — Encounter (HOSPITAL_COMMUNITY): Payer: Self-pay | Admitting: Emergency Medicine

## 2021-07-16 ENCOUNTER — Emergency Department (HOSPITAL_COMMUNITY): Payer: 59

## 2021-07-16 ENCOUNTER — Other Ambulatory Visit: Payer: Self-pay

## 2021-07-16 DIAGNOSIS — A419 Sepsis, unspecified organism: Secondary | ICD-10-CM | POA: Diagnosis not present

## 2021-07-16 DIAGNOSIS — T8144XA Sepsis following a procedure, initial encounter: Secondary | ICD-10-CM | POA: Diagnosis not present

## 2021-07-16 DIAGNOSIS — Z803 Family history of malignant neoplasm of breast: Secondary | ICD-10-CM | POA: Diagnosis not present

## 2021-07-16 DIAGNOSIS — Z83438 Family history of other disorder of lipoprotein metabolism and other lipidemia: Secondary | ICD-10-CM | POA: Diagnosis not present

## 2021-07-16 DIAGNOSIS — Z20822 Contact with and (suspected) exposure to covid-19: Secondary | ICD-10-CM | POA: Diagnosis not present

## 2021-07-16 DIAGNOSIS — Z0389 Encounter for observation for other suspected diseases and conditions ruled out: Secondary | ICD-10-CM | POA: Diagnosis not present

## 2021-07-16 DIAGNOSIS — Z8261 Family history of arthritis: Secondary | ICD-10-CM

## 2021-07-16 DIAGNOSIS — N858 Other specified noninflammatory disorders of uterus: Secondary | ICD-10-CM | POA: Diagnosis not present

## 2021-07-16 DIAGNOSIS — Z841 Family history of disorders of kidney and ureter: Secondary | ICD-10-CM

## 2021-07-16 DIAGNOSIS — Y838 Other surgical procedures as the cause of abnormal reaction of the patient, or of later complication, without mention of misadventure at the time of the procedure: Secondary | ICD-10-CM | POA: Diagnosis present

## 2021-07-16 DIAGNOSIS — Z8249 Family history of ischemic heart disease and other diseases of the circulatory system: Secondary | ICD-10-CM

## 2021-07-16 DIAGNOSIS — T8140XA Infection following a procedure, unspecified, initial encounter: Secondary | ICD-10-CM | POA: Diagnosis not present

## 2021-07-16 DIAGNOSIS — I959 Hypotension, unspecified: Secondary | ICD-10-CM | POA: Diagnosis present

## 2021-07-16 DIAGNOSIS — Z8349 Family history of other endocrine, nutritional and metabolic diseases: Secondary | ICD-10-CM | POA: Diagnosis not present

## 2021-07-16 DIAGNOSIS — Z823 Family history of stroke: Secondary | ICD-10-CM

## 2021-07-16 DIAGNOSIS — R000 Tachycardia, unspecified: Secondary | ICD-10-CM | POA: Diagnosis not present

## 2021-07-16 DIAGNOSIS — D259 Leiomyoma of uterus, unspecified: Secondary | ICD-10-CM | POA: Diagnosis not present

## 2021-07-16 LAB — CBC WITH DIFFERENTIAL/PLATELET
Abs Immature Granulocytes: 0.27 10*3/uL — ABNORMAL HIGH (ref 0.00–0.07)
Basophils Absolute: 0.1 10*3/uL (ref 0.0–0.1)
Basophils Relative: 0 %
Eosinophils Absolute: 0 10*3/uL (ref 0.0–0.5)
Eosinophils Relative: 0 %
HCT: 35.3 % — ABNORMAL LOW (ref 36.0–46.0)
Hemoglobin: 11 g/dL — ABNORMAL LOW (ref 12.0–15.0)
Immature Granulocytes: 1 %
Lymphocytes Relative: 1 %
Lymphs Abs: 0.2 10*3/uL — ABNORMAL LOW (ref 0.7–4.0)
MCH: 24.8 pg — ABNORMAL LOW (ref 26.0–34.0)
MCHC: 31.2 g/dL (ref 30.0–36.0)
MCV: 79.7 fL — ABNORMAL LOW (ref 80.0–100.0)
Monocytes Absolute: 0.7 10*3/uL (ref 0.1–1.0)
Monocytes Relative: 3 %
Neutro Abs: 22.8 10*3/uL — ABNORMAL HIGH (ref 1.7–7.7)
Neutrophils Relative %: 95 %
Platelets: 341 10*3/uL (ref 150–400)
RBC: 4.43 MIL/uL (ref 3.87–5.11)
RDW: 17.5 % — ABNORMAL HIGH (ref 11.5–15.5)
WBC: 24 10*3/uL — ABNORMAL HIGH (ref 4.0–10.5)
nRBC: 0 % (ref 0.0–0.2)

## 2021-07-16 LAB — URINALYSIS, ROUTINE W REFLEX MICROSCOPIC
Bilirubin Urine: NEGATIVE
Glucose, UA: NEGATIVE mg/dL
Ketones, ur: 20 mg/dL — AB
Nitrite: NEGATIVE
Protein, ur: 100 mg/dL — AB
RBC / HPF: >50 RBC/hpf — ABNORMAL HIGH (ref 0–5)
Specific Gravity, Urine: 1.016 (ref 1.005–1.030)
WBC, UA: >50 WBC/hpf — ABNORMAL HIGH (ref 0–5)
pH: 5 (ref 5.0–8.0)

## 2021-07-16 LAB — TYPE AND SCREEN
ABO/RH(D): O POS
Antibody Screen: NEGATIVE

## 2021-07-16 LAB — COMPREHENSIVE METABOLIC PANEL
ALT: 14 U/L (ref 0–44)
AST: 22 U/L (ref 15–41)
Albumin: 3.3 g/dL — ABNORMAL LOW (ref 3.5–5.0)
Alkaline Phosphatase: 52 U/L (ref 38–126)
Anion gap: 10 (ref 5–15)
BUN: 6 mg/dL (ref 6–20)
CO2: 20 mmol/L — ABNORMAL LOW (ref 22–32)
Calcium: 8.9 mg/dL (ref 8.9–10.3)
Chloride: 104 mmol/L (ref 98–111)
Creatinine, Ser: 0.77 mg/dL (ref 0.44–1.00)
GFR, Estimated: >60 mL/min (ref >60)
Glucose, Bld: 133 mg/dL — ABNORMAL HIGH (ref 70–99)
Potassium: 3.1 mmol/L — ABNORMAL LOW (ref 3.5–5.1)
Sodium: 134 mmol/L — ABNORMAL LOW (ref 135–145)
Total Bilirubin: 0.9 mg/dL (ref 0.3–1.2)
Total Protein: 6.4 g/dL — ABNORMAL LOW (ref 6.5–8.1)

## 2021-07-16 LAB — LACTIC ACID, PLASMA: Lactic Acid, Venous: 1.8 mmol/L (ref 0.5–1.9)

## 2021-07-16 LAB — TROPONIN I (HIGH SENSITIVITY): Troponin I (High Sensitivity): 19 ng/L — ABNORMAL HIGH (ref <18)

## 2021-07-16 LAB — PROTIME-INR
INR: 1.1 (ref 0.8–1.2)
Prothrombin Time: 14.5 seconds (ref 11.4–15.2)

## 2021-07-16 LAB — RESP PANEL BY RT-PCR (FLU A&B, COVID) ARPGX2
Influenza A by PCR: NEGATIVE
Influenza B by PCR: NEGATIVE
SARS Coronavirus 2 by RT PCR: NEGATIVE

## 2021-07-16 LAB — APTT: aPTT: 28 seconds (ref 24–36)

## 2021-07-16 MED ORDER — LACTATED RINGERS IV SOLN: INTRAVENOUS | Status: AC

## 2021-07-16 MED ORDER — ACETAMINOPHEN 325 MG PO TABS
650.0000 mg | ORAL_TABLET | Freq: Once | ORAL | Status: AC
  Administered 2021-07-16: 650 mg via ORAL
  Filled 2021-07-16: qty 2

## 2021-07-16 MED ORDER — VANCOMYCIN HCL 1250 MG/250ML IV SOLN
1250.0000 mg | Freq: Once | INTRAVENOUS | Status: AC
  Administered 2021-07-17: 1250 mg via INTRAVENOUS
  Filled 2021-07-16: qty 250

## 2021-07-16 MED ORDER — SODIUM CHLORIDE 0.9 % IV BOLUS: 1000.0000 mL | Freq: Once | INTRAVENOUS | Status: DC

## 2021-07-16 MED ORDER — METRONIDAZOLE 500 MG/100ML IV SOLN
500.0000 mg | Freq: Once | INTRAVENOUS | Status: AC
  Administered 2021-07-16: 500 mg via INTRAVENOUS
  Filled 2021-07-16: qty 100

## 2021-07-16 MED ORDER — LACTATED RINGERS IV BOLUS (SEPSIS)
1000.0000 mL | Freq: Once | INTRAVENOUS | Status: AC
  Administered 2021-07-16: 1000 mL via INTRAVENOUS

## 2021-07-16 MED ORDER — SODIUM CHLORIDE 0.9 % IV SOLN: 2.0000 g | Freq: Three times a day (TID) | INTRAVENOUS | Status: DC

## 2021-07-16 MED ORDER — SODIUM CHLORIDE 0.9 % IV SOLN
2.0000 g | Freq: Once | INTRAVENOUS | Status: AC
  Administered 2021-07-16: 2 g via INTRAVENOUS
  Filled 2021-07-16: qty 2

## 2021-07-16 MED ORDER — VANCOMYCIN HCL 1000 MG/200ML IV SOLN
1000.0000 mg | Freq: Two times a day (BID) | INTRAVENOUS | Status: DC
  Administered 2021-07-17 – 2021-07-18 (×2): 1000 mg via INTRAVENOUS
  Filled 2021-07-16 (×3): qty 200

## 2021-07-16 MED ORDER — ACETAMINOPHEN 325 MG PO TABS
650.0000 mg | ORAL_TABLET | ORAL | Status: DC | PRN
  Administered 2021-07-17: 650 mg via ORAL
  Filled 2021-07-16: qty 2

## 2021-07-16 NOTE — H&P (Addendum)
Sydney Ware is an 45 y.N.[id number] female who presented to the ED with complaint of fever/chills, weakness, fatigue and lethargy, decreased appetite and feeling "hot". Pt had a hysteroscopic novasure ablation in the office with Dr Harrington Challenger on 07/14/21 due to history of menorrhagia. She reports felt well till last night when symptoms above begun. She has not had a BM since procedure but not unusual for her. She denies abdominal or pelvic pain, dysuria, vaginal bleeding or unusual vaginal discharge.   Since arrival in ED she has begun having dizziness and "my heart is beating fast" . She has been taking tylenol most of the day. Temp <100.4 at home.  She has only eaten a slice of toast today  Pertinent Gynecological History: Menses:  menorrhagia  Contraception: vasectomy DES exposure: denies Blood transfusions: none Sexually transmitted diseases: no past history Previous GYN Procedures:  novasure ablation 07/14/21   OB History: G3, P2012   Menstrual History: Menarche age: unk No LMP recorded.    History reviewed. No pertinent past medical history.  History reviewed. No pertinent surgical history.  Family History  Problem Relation Age of Onset   Arthritis Mother    Stroke Mother    Alcohol abuse Father    Hyperlipidemia Father    Hypertension Father    Kidney disease Father    Breast cancer Maternal Aunt        in 27's    Social History:  reports that she has never smoked. She has never used smokeless tobacco. She reports that she does not drink alcohol and does not use drugs.  Allergies: No Known Allergies  (Not in a hospital admission)   Review of Systems  Constitutional:  Positive for activity change, appetite change, chills, diaphoresis, fatigue and fever.  HENT:  Negative for sinus pressure, sinus pain and sore throat.   Eyes:  Positive for photophobia.  Respiratory:  Negative for chest tightness and shortness of breath.   Cardiovascular:  Positive for palpitations.  Negative for chest pain and leg swelling.  Gastrointestinal:  Negative for abdominal distention, abdominal pain, constipation, diarrhea, nausea, rectal pain and vomiting.  Genitourinary:  Negative for decreased urine volume, difficulty urinating, dysuria, pelvic pain, vaginal bleeding, vaginal discharge and vaginal pain.  Musculoskeletal:  Negative for back pain and myalgias.  Neurological:  Positive for dizziness, weakness and light-headedness. Negative for headaches.  Psychiatric/Behavioral:  The patient is not nervous/anxious.   All other systems reviewed and are negative.  Blood pressure (!) 110/56, pulse (!) 115, temperature 99 F (37.2 C), temperature source Oral, resp. rate (!) 24, SpO2 98 %. Physical Exam Constitutional:      Appearance: She is normal weight. She is ill-appearing.  Cardiovascular:     Rate and Rhythm: Tachycardia present.     Pulses: Normal pulses.  Pulmonary:     Effort: Pulmonary effort is normal.  Abdominal:     General: Bowel sounds are normal.     Palpations: Abdomen is soft.  Musculoskeletal:        General: Normal range of motion.     Cervical back: Normal range of motion.  Skin:    General: Skin is warm and dry.     Capillary Refill: Capillary refill takes 2 to 3 seconds.  Neurological:     General: No focal deficit present.     Mental Status: She is oriented to person, place, and time. Mental status is at baseline.  Psychiatric:        Mood and Affect: Mood normal.  Behavior: Behavior normal.        Thought Content: Thought content normal.        Judgment: Judgment normal.    Results for orders placed or performed during the hospital encounter of 07/16/21 (from the past 24 hour(s))  Comprehensive metabolic panel     Status: Abnormal   Collection Time: 07/16/21  6:50 PM  Result Value Ref Range   Sodium 134 (L) 135 - 145 mmol/L   Potassium 3.1 (L) 3.5 - 5.1 mmol/L   Chloride 104 98 - 111 mmol/L   CO2 20 (L) 22 - 32 mmol/L   Glucose,  Bld 133 (H) 70 - 99 mg/dL   BUN 6 6 - 20 mg/dL   Creatinine, Ser 0.77 0.44 - 1.00 mg/dL   Calcium 8.9 8.9 - 10.3 mg/dL   Total Protein 6.4 (L) 6.5 - 8.1 g/dL   Albumin 3.3 (L) 3.5 - 5.0 g/dL   AST 22 15 - 41 U/L   ALT 14 0 - 44 U/L   Alkaline Phosphatase 52 38 - 126 U/L   Total Bilirubin 0.9 0.3 - 1.2 mg/dL   GFR, Estimated >60 >60 mL/min   Anion gap 10 5 - 15  CBC with Differential     Status: Abnormal   Collection Time: 07/16/21  6:50 PM  Result Value Ref Range   WBC 24.0 (H) 4.0 - 10.5 K/uL   RBC 4.43 3.87 - 5.11 MIL/uL   Hemoglobin 11.0 (L) 12.0 - 15.0 g/dL   HCT 35.3 (L) 36.0 - 46.0 %   MCV 79.7 (L) 80.0 - 100.0 fL   MCH 24.8 (L) 26.0 - 34.0 pg   MCHC 31.2 30.0 - 36.0 g/dL   RDW 17.5 (H) 11.5 - 15.5 %   Platelets 341 150 - 400 K/uL   nRBC 0.0 0.0 - 0.2 %   Neutrophils Relative % 95 %   Neutro Abs 22.8 (H) 1.7 - 7.7 K/uL   Lymphocytes Relative 1 %   Lymphs Abs 0.2 (L) 0.7 - 4.0 K/uL   Monocytes Relative 3 %   Monocytes Absolute 0.7 0.1 - 1.0 K/uL   Eosinophils Relative 0 %   Eosinophils Absolute 0.0 0.0 - 0.5 K/uL   Basophils Relative 0 %   Basophils Absolute 0.1 0.0 - 0.1 K/uL   Immature Granulocytes 1 %   Abs Immature Granulocytes 0.27 (H) 0.00 - 0.07 K/uL  Protime-INR     Status: None   Collection Time: 07/16/21  7:17 PM  Result Value Ref Range   Prothrombin Time 14.5 11.4 - 15.2 seconds   INR 1.1 0.8 - 1.2  APTT     Status: None   Collection Time: 07/16/21  7:17 PM  Result Value Ref Range   aPTT 28 24 - 36 seconds  Troponin I (High Sensitivity)     Status: Abnormal   Collection Time: 07/16/21  7:17 PM  Result Value Ref Range   Troponin I (High Sensitivity) 19 (H) <18 ng/L  Lactic acid, plasma     Status: None   Collection Time: 07/16/21  8:41 PM  Result Value Ref Range   Lactic Acid, Venous 1.8 0.5 - 1.9 mmol/L  Type and screen [place]     Status: None   Collection Time: 07/16/21  8:41 PM  Result Value Ref Range   ABO/RH(D) O POS     Antibody Screen NEG    Sample Expiration      07/19/2021,2359 Performed at [facility],  [address] [place], [place] [address]   Resp Panel by RT-PCR (Flu A&B, Covid) Nasopharyngeal Swab     Status: None   Collection Time: 07/16/21  9:30 PM   Specimen: Nasopharyngeal Swab; Nasopharyngeal(NP) swabs in vial transport medium  Result Value Ref Range   SARS Coronavirus 2 by RT PCR NEGATIVE NEGATIVE   Influenza A by PCR NEGATIVE NEGATIVE   Influenza B by PCR NEGATIVE NEGATIVE  Urinalysis, Routine w reflex microscopic     Status: Abnormal   Collection Time: 07/16/21  9:51 PM  Result Value Ref Range   Color, Urine YELLOW YELLOW   APPearance CLOUDY (A) CLEAR   Specific Gravity, Urine 1.016 1.005 - 1.030   pH 5.0 5.0 - 8.0   Glucose, UA NEGATIVE NEGATIVE mg/dL   Hgb urine dipstick LARGE (A) NEGATIVE   Bilirubin Urine NEGATIVE NEGATIVE   Ketones, ur 20 (A) NEGATIVE mg/dL   Protein, ur 100 (A) NEGATIVE mg/dL   Nitrite NEGATIVE NEGATIVE   Leukocytes,Ua LARGE (A) NEGATIVE   RBC / HPF >50 (H) 0 - 5 RBC/hpf   WBC, UA >50 (H) 0 - 5 WBC/hpf   Bacteria, UA FEW (A) NONE SEEN   Squamous Epithelial / LPF 6-10 0 - 5   WBC Clumps PRESENT    Mucus PRESENT     DG Chest Port 1 View  Result Date: 07/16/2021 CLINICAL DATA:  Questionable sepsis - evaluate for abnormality EXAM: PORTABLE CHEST 1 VIEW COMPARISON:  None. FINDINGS: The cardiomediastinal silhouette is within normal limits. There is no focal airspace disease. No pleural effusion or visible pneumothorax. There is no acute osseous abnormality. IMPRESSION: No evidence of acute cardiopulmonary disease. Electronically Signed   By: Maurine Simmering M.D.   On: 07/16/2021 19:43    Assessment/Plan: 45yo [id number] female on POD # 2 s/p novasure ablation in office with suspected sepsis ( endometrial vs urologic source? ), hypotensive, lethargic  - Admit to med/surg floor - IV antibiotics: vac/cefepime, flagyl  - IVF - Pending labs : lactic acid,  CMP, trop, INR, coags; urine and blood cx, resp pcr - NPO  - Monitor vitals      Isaiah Serge 07/16/2021, 11:34 PM

## 2021-07-16 NOTE — ED Triage Notes (Signed)
Pt here with family who reports pt had a D&C on Thursday. Pt repots generalized fatigue, chills, feeling hot, and dizziness starting today. Denies abdominal pain or vaginal bleeding.

## 2021-07-16 NOTE — Progress Notes (Signed)
Pharmacy Antibiotic Note  Sydney Ware is a 45 y.o. female admitted on 07/16/2021 presenting with fatigue, chills, febrile.  Pharmacy has been consulted for vancomycin and cefepime dosing.  Vancomycin 1250 mg IV x 1, and cefepime 2g IV x 1 given in ED  Plan: Vancomycin 1000 mg IV q 12h (eAUC 528, Goal AUC 400-550, SCr used 0.8) Cefepime 2g IV every 8 hours Monitor renal function, Cx and clinical progression to narrow Vancomycin levels as needed     Temp (24hrs), Avg:97.7 F (36.5 C), Min:97.7 F (36.5 C), Max:97.7 F (36.5 C)  Recent Labs  Lab 07/16/21 1850 07/16/21 2041  WBC 24.0*  --   CREATININE 0.77  --   LATICACIDVEN  --  1.8    CrCl cannot be calculated (Unknown ideal weight.).    No Known Allergies  Bertis Ruddy, PharmD Clinical Pharmacist ED Pharmacist Phone # [phone] 07/16/2021 10:08 PM

## 2021-07-16 NOTE — Progress Notes (Signed)
Sepsis tracking by eLINK 

## 2021-07-16 NOTE — ED Provider Notes (Signed)
[facility] EMERGENCY DEPARTMENT Provider Note   CSN: [id number] Arrival date & time: 07/16/21  1820     History Chief Complaint  Patient presents with   Weakness    Sydney Ware is a 45 y.o. female.   Weakness Associated symptoms: dizziness and fever   Associated symptoms: no abdominal pain, no arthralgias, no chest pain, no cough, no diarrhea, no dysuria, no headaches, no myalgias, no nausea, no seizures, no shortness of breath and no vomiting   Patient presents for generalized fatigue, chills, and dizziness.  Onset was last night.  Symptoms have persisted throughout the day.  Dizziness is worse with standing.  She denies any syncope.  She denies any exertional shortness of breath.  She has had symptoms of feeling hot and feeling cold that have alternated.  She has been taking Tylenol.  When she checks her temperature at home it is found to be normal.  She is also checking her blood pressures at home and blood pressures were low in the range of 90s over 60s.  Typically, her blood pressure is in the range of 110s over 70s.  She does not take any blood pressure medications.  She does not take any medications at all.  2 days ago, she underwent a D&C.  She was able to go home shortly after.  She was told that the procedure went well.  She felt fine after that and felt fine yesterday.  Symptoms all began last night.  Currently, she denies any abdominal pain.  She has not had any vomiting or diarrhea.    History reviewed. No pertinent past medical history.  Patient Active Problem List   Diagnosis Date Noted   SPINAL STENOSIS, CERVICAL 08/19/2009    History reviewed. No pertinent surgical history.   OB History   No obstetric history on file.     Family History  Problem Relation Age of Onset   Arthritis Mother    Stroke Mother    Alcohol abuse Father    Hyperlipidemia Father    Hypertension Father    Kidney disease Father    Breast cancer Maternal Aunt         in 87's    Social History   Tobacco Use   Smoking status: Never   Smokeless tobacco: Never  Substance Use Topics   Alcohol use: No   Drug use: No    Home Medications Prior to Admission medications   Medication Sig Start Date End Date Taking? Authorizing Provider  acetaminophen (TYLENOL) 500 MG tablet Take 1,000 mg by mouth every 6 (six) hours as needed for fever or moderate pain.   Yes [provider]  ibuprofen (ADVIL) 200 MG tablet Take 400 mg by mouth every 6 (six) hours as needed for fever or mild pain.   Yes [provider]    Allergies    Patient has no known allergies.  Review of Systems   Review of Systems  Constitutional:  Positive for activity change, chills, fatigue and fever.  HENT:  Negative for congestion, ear pain and sore throat.   Eyes:  Negative for pain and visual disturbance.  Respiratory:  Negative for cough, chest tightness and shortness of breath.   Cardiovascular:  Negative for chest pain and palpitations.  Gastrointestinal:  Negative for abdominal pain, diarrhea, nausea and vomiting.  Genitourinary:  Negative for dysuria, flank pain, hematuria, pelvic pain, vaginal bleeding and vaginal discharge.  Musculoskeletal:  Negative for arthralgias, back pain, myalgias and neck pain.  Skin:  Negative for color change and rash.  Neurological:  Positive for dizziness, weakness (Generalized) and light-headedness. Negative for seizures, syncope, speech difficulty, numbness and headaches.  Hematological:  Does not bruise/bleed easily.  Psychiatric/Behavioral:  Negative for confusion and decreased concentration.   All other systems reviewed and are negative.  Physical Exam Updated Vital Signs BP (!) 110/56   Pulse (!) 115   Temp 99 F (37.2 C) (Oral)   Resp (!) 24   SpO2 98%   Physical Exam Vitals and nursing note reviewed.  Constitutional:      General: She is not in acute distress.    Appearance: Normal appearance. She is  well-developed. She is not ill-appearing, toxic-appearing or diaphoretic.  HENT:     Head: Normocephalic and atraumatic.     Right Ear: External ear normal.     Left Ear: External ear normal.     Nose: Nose normal. No congestion or rhinorrhea.     Mouth/Throat:     Mouth: Mucous membranes are moist.     Pharynx: Oropharynx is clear.  Eyes:     General: No scleral icterus.    Extraocular Movements: Extraocular movements intact.     Conjunctiva/sclera: Conjunctivae normal.  Cardiovascular:     Rate and Rhythm: Normal rate and regular rhythm.     Heart sounds: No murmur heard. Pulmonary:     Effort: Pulmonary effort is normal. No respiratory distress.     Breath sounds: Normal breath sounds.  Abdominal:     Palpations: Abdomen is soft.     Tenderness: There is no abdominal tenderness. There is no guarding.  Musculoskeletal:        General: Normal range of motion.     Cervical back: Normal range of motion and neck supple.     Right lower leg: No edema.     Left lower leg: No edema.  Skin:    General: Skin is warm and dry.     Coloration: Skin is not jaundiced or pale.  Neurological:     General: No focal deficit present.     Mental Status: She is alert and oriented to person, place, and time.     Cranial Nerves: No cranial nerve deficit.     Sensory: No sensory deficit.     Motor: No weakness.  Psychiatric:        Mood and Affect: Mood normal.        Behavior: Behavior normal.        Thought Content: Thought content normal.        Judgment: Judgment normal.    ED Results / Procedures / Treatments   Labs (all labs ordered are listed, but only abnormal results are displayed) Labs Reviewed  COMPREHENSIVE METABOLIC PANEL - Abnormal; Notable for the following components:      Result Value   Sodium 134 (*)    Potassium 3.1 (*)    CO2 20 (*)    Glucose, Bld 133 (*)    Total Protein 6.4 (*)    Albumin 3.3 (*)    All other components within normal limits  CBC WITH  DIFFERENTIAL/PLATELET - Abnormal; Notable for the following components:   WBC 24.0 (*)    Hemoglobin 11.0 (*)    HCT 35.3 (*)    MCV 79.7 (*)    MCH 24.8 (*)    RDW 17.5 (*)    Neutro Abs 22.8 (*)    Lymphs Abs 0.2 (*)    Abs Immature Granulocytes 0.27 (*)  All other components within normal limits  URINALYSIS, ROUTINE W REFLEX MICROSCOPIC - Abnormal; Notable for the following components:   APPearance CLOUDY (*)    Hgb urine dipstick LARGE (*)    Ketones, ur 20 (*)    Protein, ur 100 (*)    Leukocytes,Ua LARGE (*)    RBC / HPF >50 (*)    WBC, UA >50 (*)    Bacteria, UA FEW (*)    All other components within normal limits  TROPONIN I (HIGH SENSITIVITY) - Abnormal; Notable for the following components:   Troponin I (High Sensitivity) 19 (*)    All other components within normal limits  RESP PANEL BY RT-PCR (FLU A&B, COVID) ARPGX2  CULTURE, BLOOD (ROUTINE X 2)  CULTURE, BLOOD (ROUTINE X 2)  LACTIC ACID, PLASMA  PROTIME-INR  APTT  LACTIC ACID, PLASMA  I-STAT BETA HCG BLOOD, ED ([facility], [facility], [facility] ONLY)  TYPE AND SCREEN  ABO/RH  TROPONIN I (HIGH SENSITIVITY)    EKG EKG Interpretation  Date/Time:  Sunday July 16 2021 20:39:56 EST Ventricular Rate:  104 PR Interval:  168 QRS Duration: 92 QT Interval:  322 QTC Calculation: 424 R Axis:   59 Text Interpretation: Sinus tachycardia Confirmed by Godfrey Pick [phone]) on 07/16/2021 9:39:18 PM  Radiology DG Chest Port 1 View  Result Date: 07/16/2021 CLINICAL DATA:  Questionable sepsis - evaluate for abnormality EXAM: PORTABLE CHEST 1 VIEW COMPARISON:  None. FINDINGS: The cardiomediastinal silhouette is within normal limits. There is no focal airspace disease. No pleural effusion or visible pneumothorax. There is no acute osseous abnormality. IMPRESSION: No evidence of acute cardiopulmonary disease. Electronically Signed   By: Maurine Simmering M.D.   On: 07/16/2021 19:43    Procedures Procedures   Medications Ordered in ED Medications   lactated ringers infusion ( Intravenous New Bag/Given 07/16/21 2154)  vancomycin (VANCOREADY) IVPB 1250 mg/250 mL (has no administration in time range)  ceFEPIme (MAXIPIME) 2 g in sodium chloride 0.9 % 100 mL IVPB (has no administration in time range)  vancomycin (VANCOREADY) IVPB 1000 mg/200 mL (has no administration in time range)  acetaminophen (TYLENOL) tablet 650 mg (has no administration in time range)  lactated ringers bolus 1,000 mL (0 mLs Intravenous Stopped 07/16/21 2131)  lactated ringers bolus 1,000 mL (0 mLs Intravenous Stopped 07/16/21 2242)  ceFEPIme (MAXIPIME) 2 g in sodium chloride 0.9 % 100 mL IVPB (0 g Intravenous Stopped 07/16/21 2243)  metroNIDAZOLE (FLAGYL) IVPB 500 mg (500 mg Intravenous New Bag/Given 07/16/21 2231)  acetaminophen (TYLENOL) tablet 650 mg (650 mg Oral Given 07/16/21 2239)    ED Course  I have reviewed the triage vital signs and the nursing notes.  Pertinent labs & imaging results that were available during my care of the patient were reviewed by me and considered in my medical decision making (see chart for details).    MDM Rules/Calculators/A&P                         CRITICAL CARE Performed by: Godfrey Pick   Total critical care time: 35 minutes  Critical care time was exclusive of separately billable procedures and treating other patients.  Critical care was necessary to treat or prevent imminent or life-threatening deterioration.  Critical care was time spent personally by me on the following activities: development of treatment plan with patient and/or surrogate as well as nursing, discussions with consultants, evaluation of patient's response to treatment, examination of patient, obtaining history from patient or surrogate,  ordering and performing treatments and interventions, ordering and review of laboratory studies, ordering and review of radiographic studies, pulse oximetry and re-evaluation of patient's condition.   Patient is a  healthy 45 year old female who presents for fevers, chills, generalized weakness, lightheadedness, and dizziness.  Blood pressures at home are found to be low.  Upon arrival in the ED, blood pressure is 81/44.  Patient developed symptoms with just sitting up in bed.  When laying flat, she is asymptomatic.  On exam, she is well-appearing, despite her concerning hypotension.  Abdomen is soft without any evidence of tenderness.  She is afebrile upon arrival but does state that she has been taking Tylenol today.  Lab work-up, including cultures was ordered.  Bolus of IV fluid was given.  Patient's blood pressures were responsive to IV fluids.  When stood up, patient's heart rate went in the 130s.  Blood pressures were maintained.  Initial labs showed a leukocytosis of 24.  Patient also developed tachycardia and tachypnea at rest.  Recheck of temperature showed normothermia.  Patient was started on broad-spectrum IV antibiotics.  I did speak with Dr. Terri Piedra, who is the [facility] doctor on-call at [facility].  Plan is for admission to [facility] bed.  Dr. Terri Piedra to come evaluate the patient at bedside.  On reassessment, patient denies any new pain.  Abdomen remains nontender.  She does feel worsened fatigue.  Dr. Terri Piedra came to evaluate the patient in the ED.  She was admitted to their service for ongoing management.  Final Clinical Impression(s) / ED Diagnoses Final diagnoses:  Sepsis, due to unspecified organism, unspecified whether acute organ dysfunction present [facility])    Rx / DC Orders ED Discharge Orders     None        Godfrey Pick, MD 07/16/21 2344

## 2021-07-17 ENCOUNTER — Inpatient Hospital Stay (HOSPITAL_COMMUNITY): Payer: 59

## 2021-07-17 DIAGNOSIS — D259 Leiomyoma of uterus, unspecified: Secondary | ICD-10-CM | POA: Diagnosis not present

## 2021-07-17 DIAGNOSIS — N858 Other specified noninflammatory disorders of uterus: Secondary | ICD-10-CM | POA: Diagnosis not present

## 2021-07-17 DIAGNOSIS — A419 Sepsis, unspecified organism: Secondary | ICD-10-CM | POA: Diagnosis present

## 2021-07-17 LAB — COMPREHENSIVE METABOLIC PANEL
ALT: 12 U/L (ref 0–44)
AST: 17 U/L (ref 15–41)
Albumin: 2.8 g/dL — ABNORMAL LOW (ref 3.5–5.0)
Alkaline Phosphatase: 44 U/L (ref 38–126)
Anion gap: 8 (ref 5–15)
BUN: 5 mg/dL — ABNORMAL LOW (ref 6–20)
CO2: 20 mmol/L — ABNORMAL LOW (ref 22–32)
Calcium: 8.6 mg/dL — ABNORMAL LOW (ref 8.9–10.3)
Chloride: 109 mmol/L (ref 98–111)
Creatinine, Ser: 0.67 mg/dL (ref 0.44–1.00)
GFR, Estimated: >60 mL/min (ref >60)
Glucose, Bld: 113 mg/dL — ABNORMAL HIGH (ref 70–99)
Potassium: 3.7 mmol/L (ref 3.5–5.1)
Sodium: 137 mmol/L (ref 135–145)
Total Bilirubin: 0.5 mg/dL (ref 0.3–1.2)
Total Protein: 5.7 g/dL — ABNORMAL LOW (ref 6.5–8.1)

## 2021-07-17 LAB — CBC
HCT: 32.5 % — ABNORMAL LOW (ref 36.0–46.0)
Hemoglobin: 10.3 g/dL — ABNORMAL LOW (ref 12.0–15.0)
MCH: 25.5 pg — ABNORMAL LOW (ref 26.0–34.0)
MCHC: 31.7 g/dL (ref 30.0–36.0)
MCV: 80.4 fL (ref 80.0–100.0)
Platelets: 303 10*3/uL (ref 150–400)
RBC: 4.04 MIL/uL (ref 3.87–5.11)
RDW: 17.8 % — ABNORMAL HIGH (ref 11.5–15.5)
WBC: 21.5 10*3/uL — ABNORMAL HIGH (ref 4.0–10.5)
nRBC: 0 % (ref 0.0–0.2)

## 2021-07-17 LAB — TROPONIN I (HIGH SENSITIVITY): Troponin I (High Sensitivity): 8 ng/L (ref <18)

## 2021-07-17 LAB — LACTIC ACID, PLASMA: Lactic Acid, Venous: 2.5 mmol/L (ref 0.5–1.9)

## 2021-07-17 LAB — ABO/RH: ABO/RH(D): O POS

## 2021-07-17 MED ORDER — ONDANSETRON HCL 4 MG/2ML IJ SOLN
4.0000 mg | Freq: Four times a day (QID) | INTRAMUSCULAR | Status: DC | PRN
  Administered 2021-07-17: 4 mg via INTRAVENOUS
  Filled 2021-07-17: qty 2

## 2021-07-17 MED ORDER — METRONIDAZOLE 500 MG/100ML IV SOLN
500.0000 mg | Freq: Once | INTRAVENOUS | Status: DC
  Filled 2021-07-17: qty 100

## 2021-07-17 MED ORDER — ACETAMINOPHEN 325 MG PO TABS: 650.0000 mg | ORAL_TABLET | ORAL | Status: DC | PRN

## 2021-07-17 MED ORDER — METRONIDAZOLE 500 MG/100ML IV SOLN
500.0000 mg | Freq: Two times a day (BID) | INTRAVENOUS | Status: DC
  Administered 2021-07-17 – 2021-07-18 (×3): 500 mg via INTRAVENOUS
  Filled 2021-07-17 (×3): qty 100

## 2021-07-17 MED ORDER — DOCUSATE SODIUM 100 MG PO CAPS
100.0000 mg | ORAL_CAPSULE | Freq: Two times a day (BID) | ORAL | Status: DC
  Administered 2021-07-17 – 2021-07-18 (×3): 100 mg via ORAL
  Filled 2021-07-17 (×5): qty 1

## 2021-07-17 MED ORDER — ONDANSETRON HCL 4 MG PO TABS: 4.0000 mg | ORAL_TABLET | Freq: Four times a day (QID) | ORAL | Status: DC | PRN

## 2021-07-17 MED ORDER — IOHEXOL 300 MG/ML  SOLN
100.0000 mL | Freq: Once | INTRAMUSCULAR | Status: AC | PRN
  Administered 2021-07-17: 100 mL via INTRAVENOUS

## 2021-07-17 MED ORDER — SODIUM CHLORIDE 0.9 % IV SOLN
2.0000 g | Freq: Three times a day (TID) | INTRAVENOUS | Status: DC
  Administered 2021-07-17 – 2021-07-18 (×4): 2 g via INTRAVENOUS
  Filled 2021-07-17 (×4): qty 2

## 2021-07-17 MED ORDER — POLYETHYLENE GLYCOL 3350 17 G PO PACK
17.0000 g | PACK | Freq: Every day | ORAL | Status: DC
  Administered 2021-07-17: 17 g via ORAL
  Filled 2021-07-17 (×3): qty 1

## 2021-07-17 MED ORDER — OXYCODONE-ACETAMINOPHEN 5-325 MG PO TABS: 1.0000 | ORAL_TABLET | ORAL | Status: DC | PRN

## 2021-07-17 NOTE — ED Notes (Signed)
Pt placed on bedpan for bowel movement. This RN explained that pt cannot currently stand and use a bedside commode due to concerns with her blood pressure significantly decreasing with standing positions.

## 2021-07-17 NOTE — ED Notes (Signed)
Pt alert, NAD, calm, interactive, resps e/u, speaking in clear complete sentences, steady gait to and from the b/r, husband at [facility]. VSS. Reports feeling better. Denies pain, sob, nausea, dizziness or weakness.

## 2021-07-17 NOTE — ED Notes (Signed)
MD at BS

## 2021-07-17 NOTE — ED Notes (Signed)
Patient transported to CT 

## 2021-07-17 NOTE — ED Notes (Signed)
Notified primary RN Sophie of pt's critical lactic 2.5

## 2021-07-17 NOTE — Progress Notes (Signed)
Patient ID: Sydney Ware, female   DOB: 03/09/1976, 45 y.o.   MRN: [id number] I was called and notified of second lactic acid level of 2.5 ( first was 1.8) confirming diagnosis of sepsis.   Plan to continue with care as outlined before  BP improving at 102-119/64-91; still tachycardic 113-120, R 20-24

## 2021-07-17 NOTE — ED Notes (Signed)
Up to b/r, steady gait 

## 2021-07-17 NOTE — ED Notes (Signed)
This RN spoke with Dr. Terri Piedra on the phone to inform her of pt's critical lactic acid result 2.5

## 2021-07-17 NOTE — Progress Notes (Signed)
Hospital Day 2  45 yo S/P novasure endometrial ablation procedure on 07/14/2021 admitted for sepsis  S: Feeling poorly and weak. Denies nausea/vomiting/abdominal pain. Notes some constipation. Did receive a stool softened and has had flatus. Denies vaginal bleeding or foul smelling vaginal discharge O: Vitals:   07/17/21 0845 07/17/21 1000 07/17/21 1030 07/17/21 1205  BP: 131/83 122/73 114/70 109/61  Pulse: 100 85 87 82  Resp: (!) 26 (!) 22 (!) 21 19  Temp:      TempSrc:      SpO2: 99% 100% 98% 99%   AOx3, NAD CTAB RRR Abd soft, NT, no rebound/gaurding. + Bowel sounds present  Results for orders placed or performed during the hospital encounter of 07/16/21 (from the past 24 hour(s))  Comprehensive metabolic panel     Status: Abnormal   Collection Time: 07/16/21  6:50 PM  Result Value Ref Range   Sodium 134 (L) 135 - 145 mmol/L   Potassium 3.1 (L) 3.5 - 5.1 mmol/L   Chloride 104 98 - 111 mmol/L   CO2 20 (L) 22 - 32 mmol/L   Glucose, Bld 133 (H) 70 - 99 mg/dL   BUN 6 6 - 20 mg/dL   Creatinine, Ser 0.77 0.44 - 1.00 mg/dL   Calcium 8.9 8.9 - 10.3 mg/dL   Total Protein 6.4 (L) 6.5 - 8.1 g/dL   Albumin 3.3 (L) 3.5 - 5.0 g/dL   AST 22 15 - 41 U/L   ALT 14 0 - 44 U/L   Alkaline Phosphatase 52 38 - 126 U/L   Total Bilirubin 0.9 0.3 - 1.2 mg/dL   GFR, Estimated >60 >60 mL/min   Anion gap 10 5 - 15  CBC with Differential     Status: Abnormal   Collection Time: 07/16/21  6:50 PM  Result Value Ref Range   WBC 24.0 (H) 4.0 - 10.5 K/uL   RBC 4.43 3.87 - 5.11 MIL/uL   Hemoglobin 11.0 (L) 12.0 - 15.0 g/dL   HCT 35.3 (L) 36.0 - 46.0 %   MCV 79.7 (L) 80.0 - 100.0 fL   MCH 24.8 (L) 26.0 - 34.0 pg   MCHC 31.2 30.0 - 36.0 g/dL   RDW 17.5 (H) 11.5 - 15.5 %   Platelets 341 150 - 400 K/uL   nRBC 0.0 0.0 - 0.2 %   Neutrophils Relative % 95 %   Neutro Abs 22.8 (H) 1.7 - 7.7 K/uL   Lymphocytes Relative 1 %   Lymphs Abs 0.2 (L) 0.7 - 4.0 K/uL   Monocytes Relative 3 %   Monocytes  Absolute 0.7 0.1 - 1.0 K/uL   Eosinophils Relative 0 %   Eosinophils Absolute 0.0 0.0 - 0.5 K/uL   Basophils Relative 0 %   Basophils Absolute 0.1 0.0 - 0.1 K/uL   Immature Granulocytes 1 %   Abs Immature Granulocytes 0.27 (H) 0.00 - 0.07 K/uL  Protime-INR     Status: None   Collection Time: 07/16/21  7:17 PM  Result Value Ref Range   Prothrombin Time 14.5 11.4 - 15.2 seconds   INR 1.1 0.8 - 1.2  APTT     Status: None   Collection Time: 07/16/21  7:17 PM  Result Value Ref Range   aPTT 28 24 - 36 seconds  Troponin I (High Sensitivity)     Status: Abnormal   Collection Time: 07/16/21  7:17 PM  Result Value Ref Range   Troponin I (High Sensitivity) 19 (H) <18 ng/L  Lactic acid, plasma  Status: None   Collection Time: 07/16/21  8:41 PM  Result Value Ref Range   Lactic Acid, Venous 1.8 0.5 - 1.9 mmol/L  Blood Culture (routine x 2)     Status: None (Preliminary result)   Collection Time: 07/16/21  8:41 PM   Specimen: BLOOD  Result Value Ref Range   Specimen Description BLOOD RIGHT ANTECUBITAL    Special Requests      BOTTLES DRAWN AEROBIC AND ANAEROBIC Blood Culture results may not be optimal due to an excessive volume of blood received in culture bottles   Culture      NO GROWTH < 12 HOURS Performed at [facility] [address], [place], [place] [address]    Report Status PENDING   Type and screen [place]     Status: None   Collection Time: 07/16/21  8:41 PM  Result Value Ref Range   ABO/RH(D) O POS    Antibody Screen NEG    Sample Expiration      07/19/2021,2359 Performed at [facility], [place] [address] [place], [place] [address]   Blood Culture (routine x 2)     Status: None (Preliminary result)   Collection Time: 07/16/21  8:50 PM   Specimen: BLOOD  Result Value Ref Range   Specimen Description BLOOD LEFT ANTECUBITAL    Special Requests      BOTTLES DRAWN AEROBIC AND ANAEROBIC Blood Culture results may not be optimal due to an  inadequate volume of blood received in culture bottles   Culture      NO GROWTH < 12 HOURS Performed at [facility] [address], [place], [place] [address]    Report Status PENDING   Resp Panel by RT-PCR (Flu A&B, Covid) Nasopharyngeal Swab     Status: None   Collection Time: 07/16/21  9:30 PM   Specimen: Nasopharyngeal Swab; Nasopharyngeal(NP) swabs in vial transport medium  Result Value Ref Range   SARS Coronavirus 2 by RT PCR NEGATIVE NEGATIVE   Influenza A by PCR NEGATIVE NEGATIVE   Influenza B by PCR NEGATIVE NEGATIVE  Urinalysis, Routine w reflex microscopic     Status: Abnormal   Collection Time: 07/16/21  9:51 PM  Result Value Ref Range   Color, Urine YELLOW YELLOW   APPearance CLOUDY (A) CLEAR   Specific Gravity, Urine 1.016 1.005 - 1.030   pH 5.0 5.0 - 8.0   Glucose, UA NEGATIVE NEGATIVE mg/dL   Hgb urine dipstick LARGE (A) NEGATIVE   Bilirubin Urine NEGATIVE NEGATIVE   Ketones, ur 20 (A) NEGATIVE mg/dL   Protein, ur 100 (A) NEGATIVE mg/dL   Nitrite NEGATIVE NEGATIVE   Leukocytes,Ua LARGE (A) NEGATIVE   RBC / HPF >50 (H) 0 - 5 RBC/hpf   WBC, UA >50 (H) 0 - 5 WBC/hpf   Bacteria, UA FEW (A) NONE SEEN   Squamous Epithelial / LPF 6-10 0 - 5   WBC Clumps PRESENT    Mucus PRESENT   Lactic acid, plasma     Status: Abnormal   Collection Time: 07/17/21 12:22 AM  Result Value Ref Range   Lactic Acid, Venous 2.5 (HH) 0.5 - 1.9 mmol/L  Troponin I (High Sensitivity)     Status: None   Collection Time: 07/17/21 12:22 AM  Result Value Ref Range   Troponin I (High Sensitivity) 8 <18 ng/L  Comprehensive metabolic panel     Status: Abnormal   Collection Time: 07/17/21  5:06 AM  Result Value  Ref Range   Sodium 137 135 - 145 mmol/L   Potassium 3.7 3.5 - 5.1 mmol/L   Chloride 109 98 - 111 mmol/L   CO2 20 (L) 22 - 32 mmol/L   Glucose, Bld 113 (H) 70 - 99 mg/dL   BUN 5 (L) 6 - 20 mg/dL   Creatinine, Ser 0.67 0.44 - 1.00 mg/dL   Calcium 8.6 (L) 8.9 - 10.3 mg/dL    Total Protein 5.7 (L) 6.5 - 8.1 g/dL   Albumin 2.8 (L) 3.5 - 5.0 g/dL   AST 17 15 - 41 U/L   ALT 12 0 - 44 U/L   Alkaline Phosphatase 44 38 - 126 U/L   Total Bilirubin 0.5 0.3 - 1.2 mg/dL   GFR, Estimated >60 >60 mL/min   Anion gap 8 5 - 15  CBC     Status: Abnormal   Collection Time: 07/17/21  5:06 AM  Result Value Ref Range   WBC 21.5 (H) 4.0 - 10.5 K/uL   RBC 4.04 3.87 - 5.11 MIL/uL   Hemoglobin 10.3 (L) 12.0 - 15.0 g/dL   HCT 32.5 (L) 36.0 - 46.0 %   MCV 80.4 80.0 - 100.0 fL   MCH 25.5 (L) 26.0 - 34.0 pg   MCHC 31.7 30.0 - 36.0 g/dL   RDW 17.8 (H) 11.5 - 15.5 %   Platelets 303 150 - 400 K/uL   nRBC 0.0 0.0 - 0.2 %  ABO/Rh     Status: None   Collection Time: 07/17/21  5:06 AM  Result Value Ref Range   ABO/RH(D)      O POS Performed at [facility] [address] [place], [place] [address]    A/P 1) Pt receiving IV abx, cefipime/flagyl for presumed sepsis. Given recent endometrial ablation will proceed with pelvic imaging. Pts abdominal exam is benign with bowel sounds and flatus. Blood cultures pending. Pt is awaiting bed placement

## 2021-07-18 LAB — COMPREHENSIVE METABOLIC PANEL
ALT: 15 U/L (ref 0–44)
AST: 17 U/L (ref 15–41)
Albumin: 2.5 g/dL — ABNORMAL LOW (ref 3.5–5.0)
Alkaline Phosphatase: 42 U/L (ref 38–126)
Anion gap: 7 (ref 5–15)
BUN: 5 mg/dL — ABNORMAL LOW (ref 6–20)
CO2: 21 mmol/L — ABNORMAL LOW (ref 22–32)
Calcium: 8.2 mg/dL — ABNORMAL LOW (ref 8.9–10.3)
Chloride: 109 mmol/L (ref 98–111)
Creatinine, Ser: 0.68 mg/dL (ref 0.44–1.00)
GFR, Estimated: >60 mL/min (ref >60)
Glucose, Bld: 95 mg/dL (ref 70–99)
Potassium: 3.3 mmol/L — ABNORMAL LOW (ref 3.5–5.1)
Sodium: 137 mmol/L (ref 135–145)
Total Bilirubin: 0.3 mg/dL (ref 0.3–1.2)
Total Protein: 5 g/dL — ABNORMAL LOW (ref 6.5–8.1)

## 2021-07-18 LAB — CBC
HCT: 28.6 % — ABNORMAL LOW (ref 36.0–46.0)
Hemoglobin: 9 g/dL — ABNORMAL LOW (ref 12.0–15.0)
MCH: 25.4 pg — ABNORMAL LOW (ref 26.0–34.0)
MCHC: 31.5 g/dL (ref 30.0–36.0)
MCV: 80.8 fL (ref 80.0–100.0)
Platelets: 233 10*3/uL (ref 150–400)
RBC: 3.54 MIL/uL — ABNORMAL LOW (ref 3.87–5.11)
RDW: 18.4 % — ABNORMAL HIGH (ref 11.5–15.5)
WBC: 8.7 10*3/uL (ref 4.0–10.5)
nRBC: 0 % (ref 0.0–0.2)

## 2021-07-18 MED ORDER — METRONIDAZOLE 500 MG PO TABS
500.0000 mg | ORAL_TABLET | Freq: Two times a day (BID) | ORAL | Status: DC
  Administered 2021-07-18 – 2021-07-19 (×2): 500 mg via ORAL
  Filled 2021-07-18 (×2): qty 1

## 2021-07-18 MED ORDER — AMOXICILLIN-POT CLAVULANATE 875-125 MG PO TABS
1.0000 | ORAL_TABLET | Freq: Two times a day (BID) | ORAL | Status: DC
  Administered 2021-07-18 – 2021-07-19 (×3): 1 via ORAL
  Filled 2021-07-18 (×3): qty 1

## 2021-07-18 NOTE — ED Notes (Signed)
Pt and husband provided sodas per request.

## 2021-07-18 NOTE — Progress Notes (Signed)
HD#3  S: Feeling "back to normal". Tolerating po, ambulating without difficulty, no nausea/vomiting/pain. Voiding without difficulty O: Vitals:   07/18/21 0405 07/18/21 0700 07/18/21 1105 07/18/21 1157  BP: (!) 117/54 112/74 124/64   Pulse: 97 70 77   Resp: 12 16 16    Temp:    98.3 F (36.8 C)  TempSrc:    Oral  SpO2: 98% 98% 100%   Weight:      Height:       AOx3, NAD Abd soft, NT/ND, no rebound or gaurding   Results for orders placed or performed during the hospital encounter of 07/16/21 (from the past 24 hour(s))  CBC     Status: Abnormal   Collection Time: 07/18/21  3:49 AM  Result Value Ref Range   WBC 8.7 4.0 - 10.5 K/uL   RBC 3.54 (L) 3.87 - 5.11 MIL/uL   Hemoglobin 9.0 (L) 12.0 - 15.0 g/dL   HCT 28.6 (L) 36.0 - 46.0 %   MCV 80.8 80.0 - 100.0 fL   MCH 25.4 (L) 26.0 - 34.0 pg   MCHC 31.5 30.0 - 36.0 g/dL   RDW 18.4 (H) 11.5 - 15.5 %   Platelets 233 150 - 400 K/uL   nRBC 0.0 0.0 - 0.2 %  Comprehensive metabolic panel     Status: Abnormal   Collection Time: 07/18/21  3:49 AM  Result Value Ref Range   Sodium 137 135 - 145 mmol/L   Potassium 3.3 (L) 3.5 - 5.1 mmol/L   Chloride 109 98 - 111 mmol/L   CO2 21 (L) 22 - 32 mmol/L   Glucose, Bld 95 70 - 99 mg/dL   BUN 5 (L) 6 - 20 mg/dL   Creatinine, Ser 0.68 0.44 - 1.00 mg/dL   Calcium 8.2 (L) 8.9 - 10.3 mg/dL   Total Protein 5.0 (L) 6.5 - 8.1 g/dL   Albumin 2.5 (L) 3.5 - 5.0 g/dL   AST 17 15 - 41 U/L   ALT 15 0 - 44 U/L   Alkaline Phosphatase 42 38 - 126 U/L   Total Bilirubin 0.3 0.3 - 1.2 mg/dL   GFR, Estimated >60 >60 mL/min   Anion gap 7 5 - 15   CT ABDOMEN PELVIS W CONTRAST 07/17/2021  Narrative CLINICAL DATA:  Possible sepsis.  EXAM: CT ABDOMEN AND PELVIS WITH CONTRAST  TECHNIQUE: Multidetector CT imaging of the abdomen and pelvis was performed using the standard protocol following bolus administration of intravenous contrast.  CONTRAST:  1110mL OMNIPAQUE IOHEXOL 300 MG/ML  SOLN  COMPARISON:   December 22, 2010.  FINDINGS: Lower chest: No acute abnormality.  Hepatobiliary: No focal liver abnormality is seen. No gallstones, gallbladder wall thickening, or biliary dilatation.  Pancreas: Unremarkable. No pancreatic ductal dilatation or surrounding inflammatory changes.  Spleen: Normal in size without focal abnormality.  Adrenals/Urinary Tract: Adrenal glands are unremarkable. Kidneys are normal, without renal calculi, focal lesion, or hydronephrosis. Bladder is unremarkable.  Stomach/Bowel: The stomach appears normal. There is no evidence of bowel obstruction or inflammation. Large amount of stool seen in the sigmoid colon and rectum concerning for impaction.  Vascular/Lymphatic: No significant vascular findings are present. No enlarged abdominal or pelvic lymph nodes.  Reproductive: No significant adnexal abnormality is noted. 5.1 x 2.5 cm low density is noted within the endometrium which most likely is related to recent endometrial ablation procedure. Given that the exact nature of the ablation it is not known at this time, it is uncertain if this represents complex fluid or possibly  clot. Partially exophytic uterine fibroid is noted as well.  Other: No abdominal wall hernia or abnormality. No abdominopelvic ascites.  Musculoskeletal: No acute or significant osseous findings.  IMPRESSION: 5.1 x 2.5 cm low density is noted within the endometrium which most likely is related to reported recent endometrial ablation procedure. Given that the exact nature of the ablation is not known at this time, it is uncertain if this represents complex fluid or possibly clot or thrombus.  Large amount of stool seen in the sigmoid colon and rectum concerning for impaction.   Electronically Signed By: Marijo Conception M.D. On: 07/17/2021 14:14  A/P:  1) Pt afebrile. Hypotension resolved, WBC now 8.7. Will transition to po abx. If remains stable for 24 hrs then hope to d/c home  tomorrow 2) Having normal bowel movements and passing gas 3) Saline lock IVF

## 2021-07-19 LAB — CBC
HCT: 29.7 % — ABNORMAL LOW (ref 36.0–46.0)
Hemoglobin: 9.4 g/dL — ABNORMAL LOW (ref 12.0–15.0)
MCH: 24.8 pg — ABNORMAL LOW (ref 26.0–34.0)
MCHC: 31.6 g/dL (ref 30.0–36.0)
MCV: 78.4 fL — ABNORMAL LOW (ref 80.0–100.0)
Platelets: 267 10*3/uL (ref 150–400)
RBC: 3.79 MIL/uL — ABNORMAL LOW (ref 3.87–5.11)
RDW: 18.5 % — ABNORMAL HIGH (ref 11.5–15.5)
WBC: 7.1 10*3/uL (ref 4.0–10.5)
nRBC: 0 % (ref 0.0–0.2)

## 2021-07-19 MED ORDER — AMOXICILLIN-POT CLAVULANATE 875-125 MG PO TABS: 1.0000 | ORAL_TABLET | Freq: Two times a day (BID) | ORAL | 0 refills | Status: AC

## 2021-07-19 MED ORDER — METRONIDAZOLE 500 MG PO TABS: 500.0000 mg | ORAL_TABLET | Freq: Two times a day (BID) | ORAL | 0 refills | Status: AC

## 2021-07-19 NOTE — Progress Notes (Signed)
Mobility Specialist Progress Note:   07/19/21 1110  Mobility  Activity Ambulated in hall  Level of Assistance Independent  Assistive Device None  Distance Ambulated (ft) 570 ft  Mobility Ambulated independently in hallway  Mobility Response Tolerated well  Mobility performed by Mobility specialist  Bed Position Chair  $Mobility charge 1 Mobility   Pt asx during ambulation. No complaints of dizziness.   Nelta Numbers Mobility Specialist  Phone [phone]

## 2021-07-21 ENCOUNTER — Ambulatory Visit: Payer: Self-pay | Admitting: *Deleted

## 2021-07-21 LAB — CULTURE, BLOOD (ROUTINE X 2)
Culture: NO GROWTH
Culture: NO GROWTH

## 2021-07-31 ENCOUNTER — Encounter: Payer: Self-pay | Admitting: *Deleted

## 2021-07-31 NOTE — Patient Outreach (Signed)
[facility]) Care Management  07/31/2021  Dreya Buhrman 05-21-1976 [id number]   Assigned pt called [facility] office and advised she did not want to participate in care management services.  Eulah Pont. Myrtie Neither, MSN, [facility] Gerontological Nurse Practitioner [facility] Care Management [phone]

## 2021-08-27 NOTE — Discharge Summary (Signed)
Admitted for suspected post-operative sepsis  Received broad spectrum antibiotics and IV fluids Abdominal imaging benign WBC and vitals resolved to normal, IV abx changed to pi and pt d/c'd home

## 2021-10-25 ENCOUNTER — Other Ambulatory Visit: Payer: Self-pay | Admitting: Obstetrics and Gynecology

## 2021-10-25 DIAGNOSIS — Z1231 Encounter for screening mammogram for malignant neoplasm of breast: Secondary | ICD-10-CM

## 2021-10-31 DIAGNOSIS — D225 Melanocytic nevi of trunk: Secondary | ICD-10-CM | POA: Diagnosis not present

## 2021-10-31 DIAGNOSIS — Z1283 Encounter for screening for malignant neoplasm of skin: Secondary | ICD-10-CM | POA: Diagnosis not present

## 2021-10-31 DIAGNOSIS — D485 Neoplasm of uncertain behavior of skin: Secondary | ICD-10-CM | POA: Diagnosis not present

## 2021-12-08 ENCOUNTER — Ambulatory Visit
Admission: RE | Admit: 2021-12-08 | Discharge: 2021-12-08 | Disposition: A | Discharge status: Home / Self Care | Payer: 59 | Source: Ambulatory Visit | Attending: Obstetrics and Gynecology | Admitting: Obstetrics and Gynecology

## 2021-12-08 DIAGNOSIS — Z1231 Encounter for screening mammogram for malignant neoplasm of breast: Secondary | ICD-10-CM

## 2021-12-11 ENCOUNTER — Other Ambulatory Visit: Payer: Self-pay | Admitting: Obstetrics and Gynecology

## 2021-12-11 DIAGNOSIS — R928 Other abnormal and inconclusive findings on diagnostic imaging of breast: Secondary | ICD-10-CM

## 2021-12-20 ENCOUNTER — Ambulatory Visit: Payer: 59

## 2021-12-20 ENCOUNTER — Ambulatory Visit
Admission: RE | Admit: 2021-12-20 | Discharge: 2021-12-20 | Disposition: A | Discharge status: Home / Self Care | Payer: 59 | Source: Ambulatory Visit | Attending: Obstetrics and Gynecology | Admitting: Obstetrics and Gynecology

## 2021-12-20 DIAGNOSIS — R922 Inconclusive mammogram: Secondary | ICD-10-CM | POA: Diagnosis not present

## 2021-12-20 DIAGNOSIS — R928 Other abnormal and inconclusive findings on diagnostic imaging of breast: Secondary | ICD-10-CM

## 2022-01-16 DIAGNOSIS — H18893 Other specified disorders of cornea, bilateral: Secondary | ICD-10-CM | POA: Diagnosis not present

## 2022-01-16 DIAGNOSIS — H25813 Combined forms of age-related cataract, bilateral: Secondary | ICD-10-CM | POA: Diagnosis not present

## 2022-01-16 DIAGNOSIS — H524 Presbyopia: Secondary | ICD-10-CM | POA: Diagnosis not present

## 2022-03-02 ENCOUNTER — Other Ambulatory Visit (HOSPITAL_COMMUNITY): Payer: Self-pay

## 2022-03-02 MED ORDER — CYCLOSPORINE 0.05 % OP EMUL
1.0000 [drp] | Freq: Two times a day (BID) | OPHTHALMIC | 3 refills | Status: DC
  Filled 2022-03-02: qty 180, 90d supply, fill #0
  Filled 2022-05-23: qty 180, 90d supply, fill #1

## 2022-03-09 ENCOUNTER — Ambulatory Visit: Payer: 59 | Admitting: Internal Medicine

## 2022-03-12 ENCOUNTER — Ambulatory Visit: Payer: 59 | Admitting: Internal Medicine

## 2022-03-12 ENCOUNTER — Encounter: Payer: Self-pay | Admitting: Internal Medicine

## 2022-03-12 VITALS — BP 112/70 | HR 76 | Resp 18 | Ht 65.0 in | Wt 165.4 lb

## 2022-03-12 DIAGNOSIS — R002 Palpitations: Secondary | ICD-10-CM | POA: Insufficient documentation

## 2022-03-12 DIAGNOSIS — D509 Iron deficiency anemia, unspecified: Secondary | ICD-10-CM | POA: Insufficient documentation

## 2022-03-12 DIAGNOSIS — Z0000 Encounter for general adult medical examination without abnormal findings: Secondary | ICD-10-CM | POA: Diagnosis not present

## 2022-03-12 DIAGNOSIS — D500 Iron deficiency anemia secondary to blood loss (chronic): Secondary | ICD-10-CM | POA: Diagnosis not present

## 2022-03-12 LAB — CBC
HCT: 40.5 % (ref 36.0–46.0)
Hemoglobin: 13.3 g/dL (ref 12.0–15.0)
MCHC: 32.7 g/dL (ref 30.0–36.0)
MCV: 85.7 fl (ref 78.0–100.0)
Platelets: 383 10*3/uL (ref 150.0–400.0)
RBC: 4.73 Mil/uL (ref 3.87–5.11)
RDW: 15.3 % (ref 11.5–15.5)
WBC: 7.3 10*3/uL (ref 4.0–10.5)

## 2022-03-12 LAB — LIPID PANEL
Cholesterol: 211 mg/dL — ABNORMAL HIGH (ref 0–200)
HDL: 34.3 mg/dL — ABNORMAL LOW (ref >39.00)
LDL Cholesterol: 146 mg/dL — ABNORMAL HIGH (ref 0–99)
NonHDL: 176.62
Total CHOL/HDL Ratio: 6
Triglycerides: 153 mg/dL — ABNORMAL HIGH (ref 0.0–149.0)
VLDL: 30.6 mg/dL (ref 0.0–40.0)

## 2022-03-12 LAB — COMPREHENSIVE METABOLIC PANEL
ALT: 10 U/L (ref 0–35)
AST: 15 U/L (ref 0–37)
Albumin: 4.4 g/dL (ref 3.5–5.2)
Alkaline Phosphatase: 53 U/L (ref 39–117)
BUN: 10 mg/dL (ref 6–23)
CO2: 26 mEq/L (ref 19–32)
Calcium: 9.5 mg/dL (ref 8.4–10.5)
Chloride: 105 mEq/L (ref 96–112)
Creatinine, Ser: 0.7 mg/dL (ref 0.40–1.20)
GFR: 104.46 mL/min (ref >60.00)
Glucose, Bld: 87 mg/dL (ref 70–99)
Potassium: 3.5 mEq/L (ref 3.5–5.1)
Sodium: 138 mEq/L (ref 135–145)
Total Bilirubin: 0.5 mg/dL (ref 0.2–1.2)
Total Protein: 7.4 g/dL (ref 6.0–8.3)

## 2022-03-12 LAB — VITAMIN D 25 HYDROXY (VIT D DEFICIENCY, FRACTURES): VITD: 35.56 ng/mL (ref 30.00–100.00)

## 2022-03-12 LAB — VITAMIN B12: Vitamin B-12: 188 pg/mL — ABNORMAL LOW (ref 211–911)

## 2022-03-12 LAB — TSH: TSH: 2.46 u[IU]/mL (ref 0.35–5.50)

## 2022-03-12 NOTE — Progress Notes (Signed)
   Subjective:   Patient ID: Sydney Ware, female    DOB: 07/22/1976, 46 y.o.   MRN: [id number]  HPI The patient is a new 46 YO female coming in for palpitations and wanting physical.   PMH, [place], social history reviewed and updated  Review of Systems  Constitutional: Negative.   HENT: Negative.    Eyes: Negative.   Respiratory:  Negative for cough, chest tightness and shortness of breath.   Cardiovascular:  Positive for palpitations. Negative for chest pain and leg swelling.  Gastrointestinal:  Negative for abdominal distention, abdominal pain, constipation, diarrhea, nausea and vomiting.  Musculoskeletal: Negative.   Skin: Negative.   Neurological: Negative.   Psychiatric/Behavioral: Negative.      Objective:  Physical Exam Constitutional:      Appearance: She is well-developed.  HENT:     Head: Normocephalic and atraumatic.  Cardiovascular:     Rate and Rhythm: Normal rate and regular rhythm.  Pulmonary:     Effort: Pulmonary effort is normal. No respiratory distress.     Breath sounds: Normal breath sounds. No wheezing or rales.  Abdominal:     General: Bowel sounds are normal. There is no distension.     Palpations: Abdomen is soft.     Tenderness: There is no abdominal tenderness. There is no rebound.  Musculoskeletal:     Cervical back: Normal range of motion.  Skin:    General: Skin is warm and dry.  Neurological:     Mental Status: She is alert and oriented to person, place, and time.     Coordination: Coordination normal.     Vitals:   03/12/22 1041  BP: 112/70  Pulse: 76  Resp: 18  SpO2: 99%  Weight: 165 lb 6.4 oz (75 kg)  Height: '5\' 5"'$  (1.651 m)    Assessment & Plan:

## 2022-03-12 NOTE — Assessment & Plan Note (Signed)
Flu shot yearly. Covid-19 counseled. Tetanus up to date. Colonoscopy declines today. Mammogram up to date, pap smear up to date. Counseled about sun safety and mole surveillance. Counseled about the dangers of distracted driving. Given 10 year screening recommendations.

## 2022-03-12 NOTE — Assessment & Plan Note (Signed)
New problem in the last 9 months with an episode about every 1-2 weeks. Checking CBC, CMP, TSH, B12, vitamin D. No change in caffeine intake and 2 cups coffee per day. If no cause can consider holter monitor versus monitoring for change in frequency. Prior anemia which has not been followed up after endometrial ablation. Is no longer having heavy periods so this will hopefully be improved.

## 2022-03-12 NOTE — Patient Instructions (Addendum)
If you want the cologuard let us know.

## 2022-03-12 NOTE — Assessment & Plan Note (Signed)
Due to heavy periods and prior procedure no periods since October 2022. No follow up to prior Hg 9.4 so checking CBC today.

## 2022-05-21 ENCOUNTER — Encounter: Payer: Self-pay | Admitting: Internal Medicine

## 2022-05-23 ENCOUNTER — Other Ambulatory Visit (HOSPITAL_COMMUNITY): Payer: Self-pay

## 2022-05-23 MED ORDER — PREDNISONE 20 MG PO TABS
40.0000 mg | ORAL_TABLET | Freq: Every day | ORAL | 0 refills | Status: DC
  Filled 2022-05-23: qty 10, 5d supply, fill #0

## 2022-09-12 ENCOUNTER — Encounter: Payer: Self-pay | Admitting: Internal Medicine

## 2022-09-12 DIAGNOSIS — G8929 Other chronic pain: Secondary | ICD-10-CM

## 2022-09-26 NOTE — Progress Notes (Signed)
    Sydney Ware D.[facility] [address] [place] [address] Phone: [phone]   Assessment and Plan:     1. Chronic left shoulder pain 2. 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  Tolerated well per note below - Start physical therapy and PT for rotator cuff.  External PT referral provided - X-ray obtained in clinic.  My dictation: No acute fracture or dislocation.  Procedure: Subacromial Injection Side: Left  Risks explained and consent was given verbally. The site was cleaned with alcohol prep. A steroid injection was performed from posterior approach using 2mL of 1% lidocaine without epinephrine and 1mL of kenalog 40mg /ml. This was well tolerated and resulted in symptomatic relief.  Needle was removed, hemostasis achieved, and post injection instructions were explained.   Pt was advised to call or return to clinic if these symptoms worsen or fail to improve as anticipated.     Pertinent previous records reviewed include none   Follow Up: 4 weeks for reevaluation.  If no improvement or worsening of symptoms, could perform ultrasound versus prolonged NSAID course   Subjective:   I, Sydney Ware, am serving as a Neurosurgeon for Doctor Richardean Sale  Chief Complaint: left shoulder pain   HPI:   09/27/2022 Patient is a 47 year old female complaining of left shoulder pain. Patient states that she has pain for a year, decreased ROM, can't sleep on her side, no numbness or tingling ,pain radiates down to the bicep, aleve for the pain and that seems to help, is able to lift things, no decreased  strength, ice helped but didn't last long enough, she has pain when reaching back    Relevant Historical Information: None pertinent  Additional pertinent review of systems negative.   Current Outpatient Medications:    cycloSPORINE (RESTASIS) 0.05 % ophthalmic emulsion, Instill 1 drop in both eyes twice daily., Disp: 180 each, Rfl: 3   Objective:     Vitals:   09/27/22 1057  BP: 118/78  Pulse: 85  SpO2: 100%  Weight: 165 lb (74.8 kg)  Height: 5\' 5"  (1.651 m)      Body mass index is 27.46 kg/m.    Physical Exam:    Gen: Appears well, nad, nontoxic and pleasant Neuro:sensation intact, strength is 5/5 with df/pf/inv/ev, muscle tone wnl Skin: no suspicious lesion or defmority Psych: A&O, appropriate mood and affect  Left shoulder: no deformity, swelling or muscle wasting No scapular winging FF 100, abd 100, int 20, ext 80 TTP biceps groove, deltoid NTTP over the [place], clavicle, ac, coracoid,  humerus,  , trapezius, cervical spine Positive neer, hawkings, empty can, obriens, crossarm, Negative subscap liftoff, speeds Neg ant drawer, sulcus sign, apprehension Negative Spurling's test bilat FROM of neck    Electronically signed by:  Sydney Ware D.[facility] 11:16 AM 09/27/22

## 2022-09-27 ENCOUNTER — Ambulatory Visit (INDEPENDENT_AMBULATORY_CARE_PROVIDER_SITE_OTHER): Payer: BC Managed Care – PPO

## 2022-09-27 ENCOUNTER — Ambulatory Visit: Payer: BC Managed Care – PPO | Admitting: Sports Medicine

## 2022-09-27 VITALS — BP 118/78 | HR 85 | Ht 65.0 in | Wt 165.0 lb

## 2022-09-27 DIAGNOSIS — G8929 Other chronic pain: Secondary | ICD-10-CM | POA: Diagnosis not present

## 2022-09-27 DIAGNOSIS — M7552 Bursitis of left shoulder: Secondary | ICD-10-CM

## 2022-09-27 DIAGNOSIS — M25512 Pain in left shoulder: Secondary | ICD-10-CM

## 2022-09-27 NOTE — Patient Instructions (Addendum)
Good to see you  Shoulder HEP 3-4 week follow up

## 2022-09-28 ENCOUNTER — Encounter: Payer: Self-pay | Admitting: Sports Medicine

## 2022-10-01 ENCOUNTER — Other Ambulatory Visit: Payer: Self-pay | Admitting: Sports Medicine

## 2022-10-01 DIAGNOSIS — M7552 Bursitis of left shoulder: Secondary | ICD-10-CM

## 2022-10-01 DIAGNOSIS — G8929 Other chronic pain: Secondary | ICD-10-CM

## 2022-10-01 NOTE — Progress Notes (Unsigned)
Referral for PT placed

## 2022-10-01 NOTE — Telephone Encounter (Signed)
Referral for PT placed

## 2022-10-09 DIAGNOSIS — M7552 Bursitis of left shoulder: Secondary | ICD-10-CM | POA: Diagnosis not present

## 2022-10-09 DIAGNOSIS — M25512 Pain in left shoulder: Secondary | ICD-10-CM | POA: Diagnosis not present

## 2022-10-12 DIAGNOSIS — M25512 Pain in left shoulder: Secondary | ICD-10-CM | POA: Diagnosis not present

## 2022-10-12 DIAGNOSIS — M7552 Bursitis of left shoulder: Secondary | ICD-10-CM | POA: Diagnosis not present

## 2022-10-18 DIAGNOSIS — M7552 Bursitis of left shoulder: Secondary | ICD-10-CM | POA: Diagnosis not present

## 2022-10-18 DIAGNOSIS — M25512 Pain in left shoulder: Secondary | ICD-10-CM | POA: Diagnosis not present

## 2022-10-23 ENCOUNTER — Ambulatory Visit: Payer: Commercial Managed Care - PPO | Admitting: Sports Medicine

## 2022-10-23 DIAGNOSIS — M25512 Pain in left shoulder: Secondary | ICD-10-CM | POA: Diagnosis not present

## 2022-10-23 DIAGNOSIS — M7552 Bursitis of left shoulder: Secondary | ICD-10-CM | POA: Diagnosis not present

## 2022-11-16 ENCOUNTER — Other Ambulatory Visit: Payer: Self-pay | Admitting: Obstetrics and Gynecology

## 2022-11-16 DIAGNOSIS — Z1231 Encounter for screening mammogram for malignant neoplasm of breast: Secondary | ICD-10-CM

## 2022-12-31 DIAGNOSIS — Z01419 Encounter for gynecological examination (general) (routine) without abnormal findings: Secondary | ICD-10-CM | POA: Diagnosis not present

## 2023-01-01 ENCOUNTER — Ambulatory Visit
Admission: RE | Admit: 2023-01-01 | Discharge: 2023-01-01 | Disposition: A | Discharge status: Home / Self Care | Payer: BC Managed Care – PPO | Source: Ambulatory Visit | Attending: Obstetrics and Gynecology | Admitting: Obstetrics and Gynecology

## 2023-01-01 DIAGNOSIS — Z1231 Encounter for screening mammogram for malignant neoplasm of breast: Secondary | ICD-10-CM

## 2023-03-14 ENCOUNTER — Other Ambulatory Visit: Payer: Self-pay | Admitting: Oncology

## 2023-03-14 DIAGNOSIS — Z006 Encounter for examination for normal comparison and control in clinical research program: Secondary | ICD-10-CM

## 2023-03-20 ENCOUNTER — Other Ambulatory Visit (HOSPITAL_COMMUNITY)
Admission: RE | Admit: 2023-03-20 | Discharge: 2023-03-20 | Disposition: A | Discharge status: Home / Self Care | Payer: BC Managed Care – PPO | Source: Ambulatory Visit | Attending: Oncology | Admitting: Oncology

## 2023-03-20 DIAGNOSIS — Z006 Encounter for examination for normal comparison and control in clinical research program: Secondary | ICD-10-CM

## 2023-04-11 ENCOUNTER — Encounter: Payer: BC Managed Care – PPO | Admitting: Internal Medicine

## 2023-04-23 ENCOUNTER — Encounter: Payer: Self-pay | Admitting: Internal Medicine

## 2023-04-23 ENCOUNTER — Ambulatory Visit (INDEPENDENT_AMBULATORY_CARE_PROVIDER_SITE_OTHER): Payer: BC Managed Care – PPO | Admitting: Internal Medicine

## 2023-04-23 VITALS — BP 122/86 | HR 74 | Temp 98.6°F | Ht 65.0 in | Wt 160.0 lb

## 2023-04-23 DIAGNOSIS — D500 Iron deficiency anemia secondary to blood loss (chronic): Secondary | ICD-10-CM

## 2023-04-23 DIAGNOSIS — Z1211 Encounter for screening for malignant neoplasm of colon: Secondary | ICD-10-CM

## 2023-04-23 DIAGNOSIS — Z0000 Encounter for general adult medical examination without abnormal findings: Secondary | ICD-10-CM

## 2023-04-23 DIAGNOSIS — E538 Deficiency of other specified B group vitamins: Secondary | ICD-10-CM | POA: Diagnosis not present

## 2023-04-23 DIAGNOSIS — E782 Mixed hyperlipidemia: Secondary | ICD-10-CM

## 2023-04-23 LAB — COMPREHENSIVE METABOLIC PANEL
ALT: 13 U/L (ref 0–35)
AST: 16 U/L (ref 0–37)
Albumin: 4.4 g/dL (ref 3.5–5.2)
Alkaline Phosphatase: 55 U/L (ref 39–117)
BUN: 8 mg/dL (ref 6–23)
CO2: 28 mEq/L (ref 19–32)
Calcium: 9.4 mg/dL (ref 8.4–10.5)
Chloride: 103 mEq/L (ref 96–112)
Creatinine, Ser: 0.63 mg/dL (ref 0.40–1.20)
GFR: 106.31 mL/min (ref >60.00)
Glucose, Bld: 82 mg/dL (ref 70–99)
Potassium: 3.7 mEq/L (ref 3.5–5.1)
Sodium: 138 mEq/L (ref 135–145)
Total Bilirubin: 0.5 mg/dL (ref 0.2–1.2)
Total Protein: 7.3 g/dL (ref 6.0–8.3)

## 2023-04-23 LAB — CBC
HCT: 43.5 % (ref 36.0–46.0)
Hemoglobin: 14.3 g/dL (ref 12.0–15.0)
MCHC: 32.9 g/dL (ref 30.0–36.0)
MCV: 90 fl (ref 78.0–100.0)
Platelets: 377 10*3/uL (ref 150.0–400.0)
RBC: 4.84 Mil/uL (ref 3.87–5.11)
RDW: 13.4 % (ref 11.5–15.5)
WBC: 7.9 10*3/uL (ref 4.0–10.5)

## 2023-04-23 LAB — LIPID PANEL
Cholesterol: 239 mg/dL — ABNORMAL HIGH (ref 0–200)
HDL: 37.5 mg/dL — ABNORMAL LOW (ref >39.00)
LDL Cholesterol: 171 mg/dL — ABNORMAL HIGH (ref 0–99)
NonHDL: 201.9
Total CHOL/HDL Ratio: 6
Triglycerides: 154 mg/dL — ABNORMAL HIGH (ref 0.0–149.0)
VLDL: 30.8 mg/dL (ref 0.0–40.0)

## 2023-04-23 LAB — VITAMIN B12: Vitamin B-12: 942 pg/mL — ABNORMAL HIGH (ref 211–911)

## 2023-04-23 NOTE — Progress Notes (Unsigned)
   Subjective:   Patient ID: Sydney Ware, female    DOB: 1975/11/22, 47 y.o.   MRN: [id number]  HPI The patient is here for physical.  PMH, [facility], social history reviewed and updated  Review of Systems  Constitutional: Negative.   HENT: Negative.    Eyes: Negative.   Respiratory:  Negative for cough, chest tightness and shortness of breath.   Cardiovascular:  Negative for chest pain, palpitations and leg swelling.  Gastrointestinal:  Negative for abdominal distention, abdominal pain, constipation, diarrhea, nausea and vomiting.  Musculoskeletal: Negative.   Skin: Negative.   Neurological: Negative.   Psychiatric/Behavioral: Negative.      Objective:  Physical Exam Constitutional:      Appearance: She is well-developed.  HENT:     Head: Normocephalic and atraumatic.  Cardiovascular:     Rate and Rhythm: Normal rate and regular rhythm.  Pulmonary:     Effort: Pulmonary effort is normal. No respiratory distress.     Breath sounds: Normal breath sounds. No wheezing or rales.  Abdominal:     General: Bowel sounds are normal. There is no distension.     Palpations: Abdomen is soft.     Tenderness: There is no abdominal tenderness. There is no rebound.  Musculoskeletal:     Cervical back: Normal range of motion.  Skin:    General: Skin is warm and dry.  Neurological:     Mental Status: She is alert and oriented to person, place, and time.     Coordination: Coordination normal.     Vitals:   04/23/23 1548  BP: 122/86  Pulse: 74  Temp: 98.6 F (37 C)  TempSrc: Oral  SpO2: 94%  Weight: 160 lb (72.6 kg)  Height: 5\' 5"  (1.651 m)    Assessment & Plan:

## 2023-04-24 DIAGNOSIS — E782 Mixed hyperlipidemia: Secondary | ICD-10-CM | POA: Insufficient documentation

## 2023-04-24 DIAGNOSIS — E538 Deficiency of other specified B group vitamins: Secondary | ICD-10-CM | POA: Insufficient documentation

## 2023-04-24 NOTE — Assessment & Plan Note (Signed)
Checking B12 and adjust as needed. Off oral replacement for about 1 month.

## 2023-04-24 NOTE — Assessment & Plan Note (Signed)
Flu shot yearly. Tetanus up to date. Cologuard ordered. Mammogram up to date, pap smear up to date. Counseled about sun safety and mole surveillance. Counseled about the dangers of distracted driving. Given 10 year screening recommendations.

## 2023-04-24 NOTE — Assessment & Plan Note (Signed)
Checking CBC and adjust as needed.  

## 2023-04-24 NOTE — Assessment & Plan Note (Signed)
Checking lipid panel and adjust as needed.  

## 2023-05-21 DIAGNOSIS — Z1211 Encounter for screening for malignant neoplasm of colon: Secondary | ICD-10-CM | POA: Diagnosis not present

## 2023-05-29 ENCOUNTER — Encounter: Payer: Self-pay | Admitting: Internal Medicine

## 2023-05-29 LAB — COLOGUARD
COLOGUARD: NEGATIVE
Cologuard: NEGATIVE

## 2023-08-10 DIAGNOSIS — B001 Herpesviral vesicular dermatitis: Secondary | ICD-10-CM | POA: Diagnosis not present

## 2023-08-10 DIAGNOSIS — B029 Zoster without complications: Secondary | ICD-10-CM | POA: Diagnosis not present

## 2023-08-10 IMAGING — MG MM DIGITAL DIAGNOSTIC UNILAT*R* W/ TOMO W/ CAD
6 series · 6 of 18 positions shown · non-contrast
Comparison: Previous exam(s).

CLINICAL DATA: Screening recall for a possible right breast
asymmetry.

EXAM:
DIGITAL DIAGNOSTIC UNILATERAL RIGHT MAMMOGRAM WITH TOMOSYNTHESIS AND
CAD
TECHNIQUE: Right digital diagnostic mammography and breast tomosynthesis was
performed. The images were evaluated with computer-aided detection.

[R ML synth-2D]
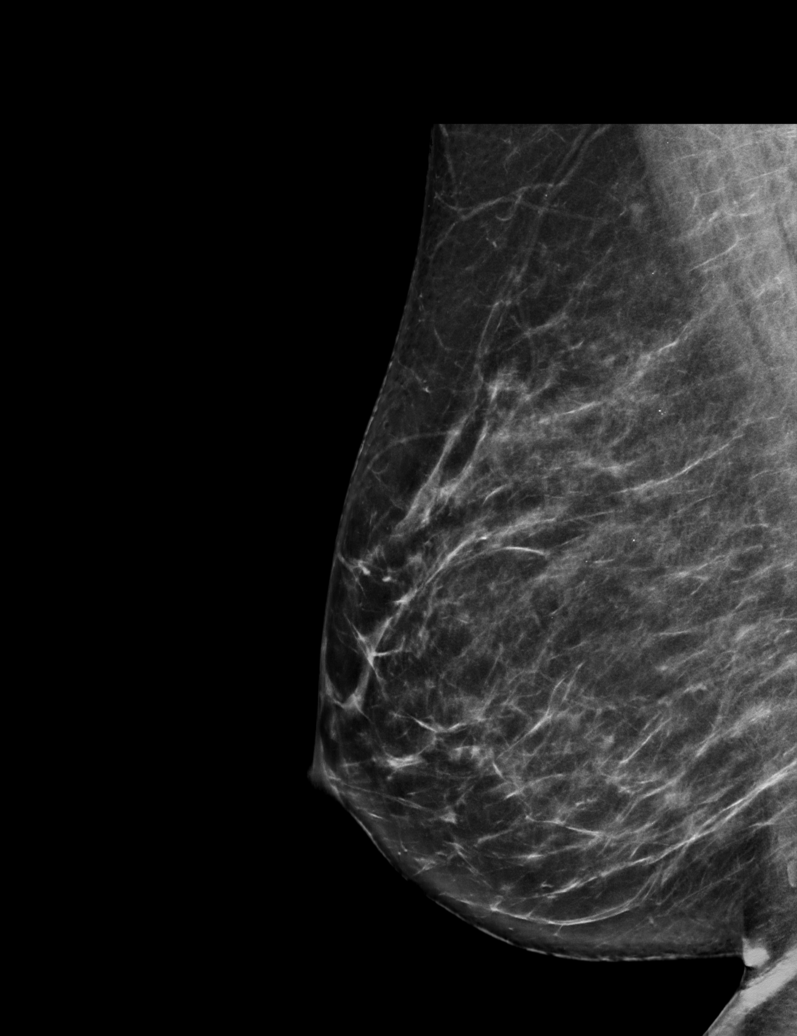

[R CC synth-2D]
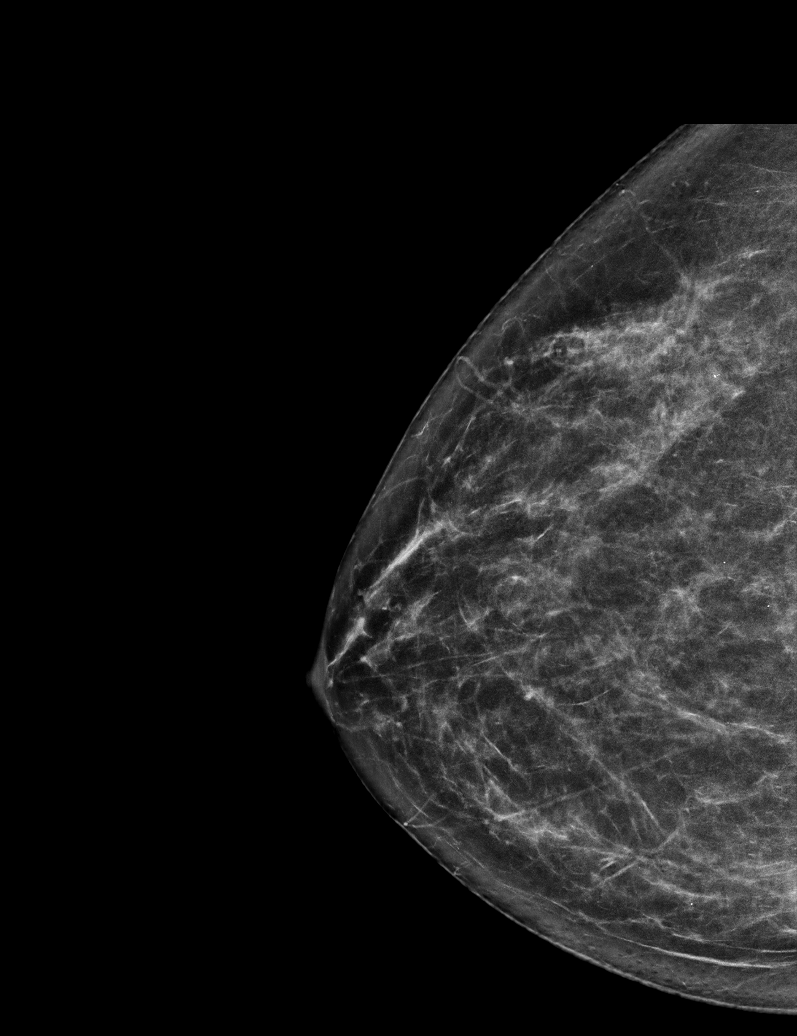

[R MLO synth-2D]
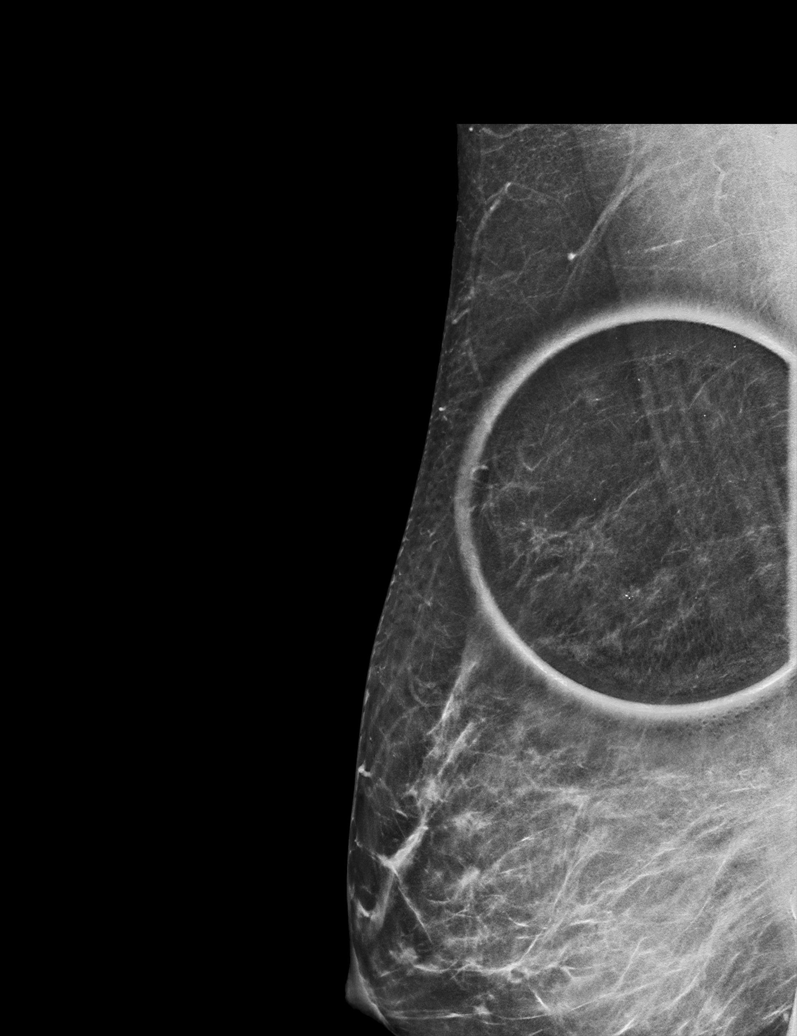

[R CC tomo · tomo slice 33/65.0]
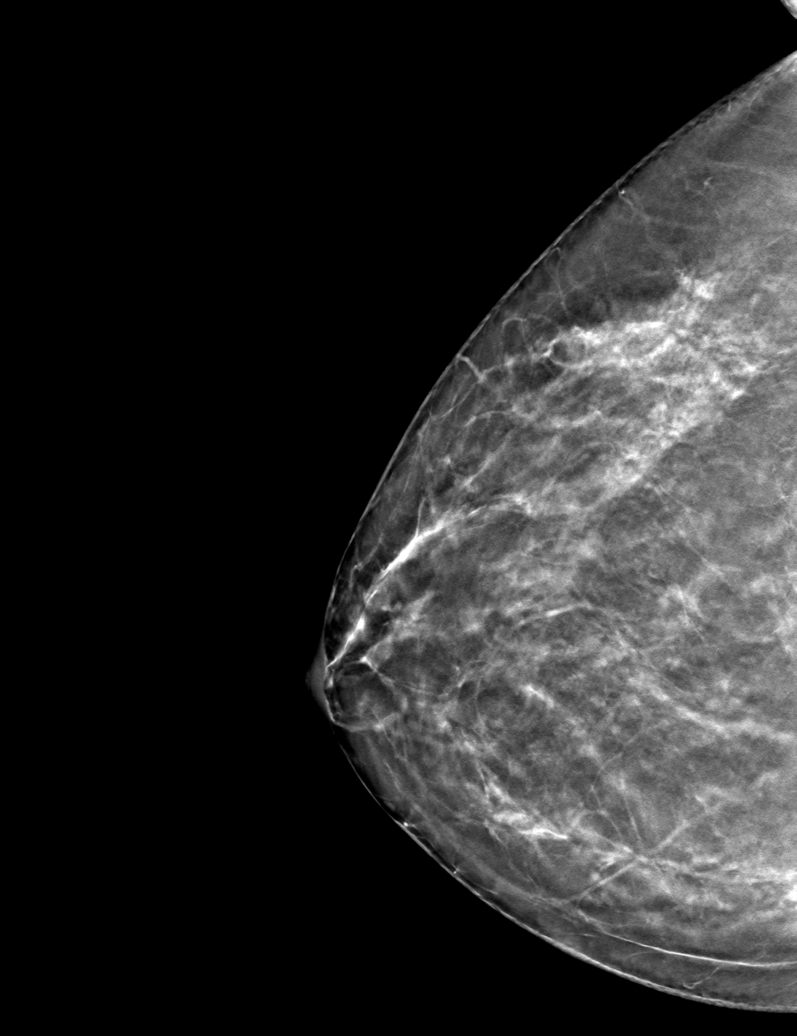

[R MLO tomo · tomo slice 33/66.0]
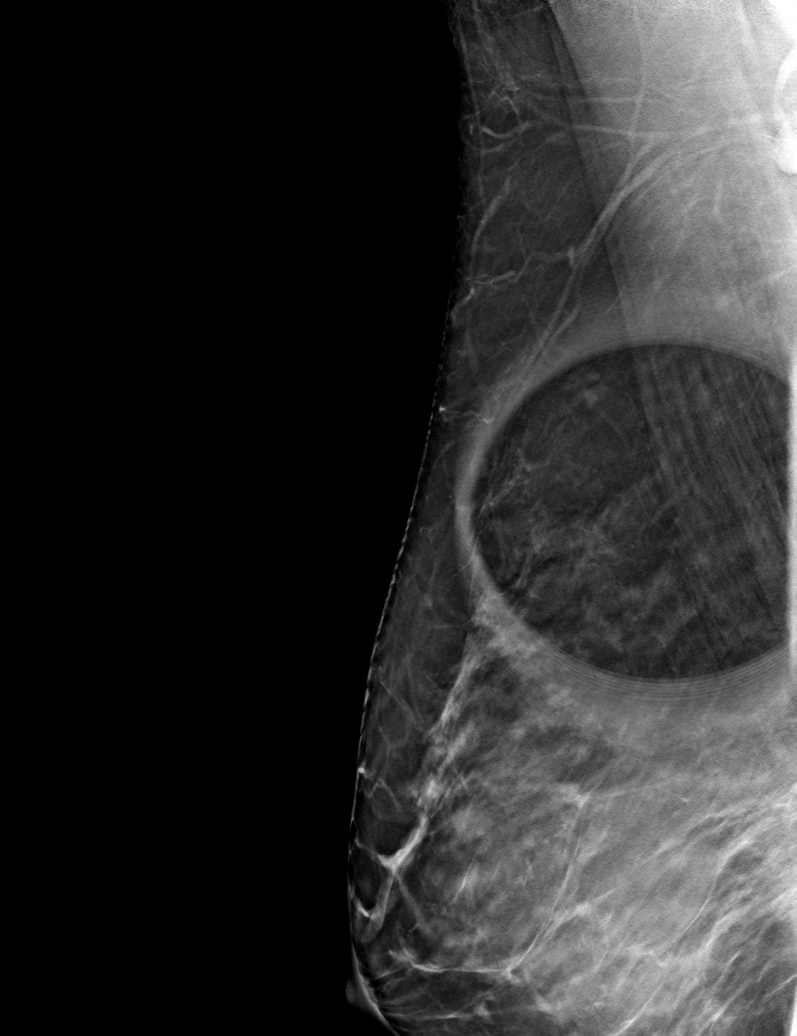

[R ML tomo · tomo slice 36/71.0]
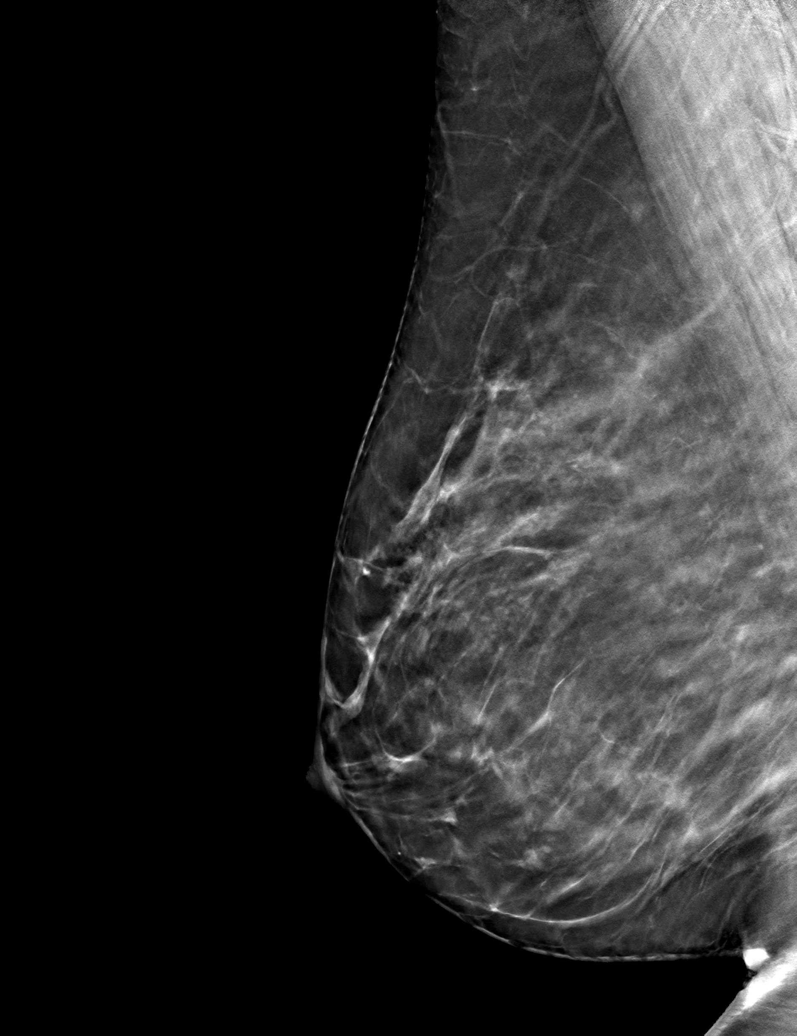

[6 of 18 positions shown; findings below may reference images not displayed]

ACR Breast Density Category c: The breast tissue is heterogeneously
dense, which may obscure small masses.
FINDINGS: The possible asymmetry, noted in the upper posterior right breast on
the current screening MLO view, disperses with spot compression
imaging consistent with superimposed fibroglandular tissue. There is
no underlying mass or significant residual asymmetry.
IMPRESSION: No evidence of breast malignancy.

RECOMMENDATION:
Screening mammogram in one year.(Code:WZ-1-P5I)

I have discussed the findings and recommendations with the patient.
If applicable, a reminder letter will be sent to the patient
regarding the next appointment.

BI-RADS CATEGORY  1: Negative.

## 2023-10-14 DIAGNOSIS — H6123 Impacted cerumen, bilateral: Secondary | ICD-10-CM | POA: Diagnosis not present

## 2023-10-14 DIAGNOSIS — H61893 Other specified disorders of external ear, bilateral: Secondary | ICD-10-CM | POA: Diagnosis not present

## 2023-10-22 ENCOUNTER — Other Ambulatory Visit: Payer: Self-pay | Admitting: Obstetrics and Gynecology

## 2023-10-22 DIAGNOSIS — Z1231 Encounter for screening mammogram for malignant neoplasm of breast: Secondary | ICD-10-CM

## 2024-01-02 ENCOUNTER — Ambulatory Visit
Admission: RE | Admit: 2024-01-02 | Discharge: 2024-01-02 | Disposition: A | Discharge status: Home / Self Care | Payer: BC Managed Care – PPO | Source: Ambulatory Visit | Attending: Obstetrics and Gynecology | Admitting: Obstetrics and Gynecology

## 2024-01-02 DIAGNOSIS — Z1231 Encounter for screening mammogram for malignant neoplasm of breast: Secondary | ICD-10-CM

## 2024-04-07 DIAGNOSIS — L905 Scar conditions and fibrosis of skin: Secondary | ICD-10-CM | POA: Diagnosis not present

## 2024-04-07 DIAGNOSIS — Z1283 Encounter for screening for malignant neoplasm of skin: Secondary | ICD-10-CM | POA: Diagnosis not present

## 2024-04-07 DIAGNOSIS — D485 Neoplasm of uncertain behavior of skin: Secondary | ICD-10-CM | POA: Diagnosis not present

## 2024-04-07 DIAGNOSIS — D225 Melanocytic nevi of trunk: Secondary | ICD-10-CM | POA: Diagnosis not present

## 2024-04-27 ENCOUNTER — Encounter: Payer: Self-pay | Admitting: Internal Medicine

## 2024-04-27 ENCOUNTER — Ambulatory Visit (INDEPENDENT_AMBULATORY_CARE_PROVIDER_SITE_OTHER): Admitting: Internal Medicine

## 2024-04-27 VITALS — BP 122/72 | HR 82 | Temp 99.2°F | Ht 65.0 in | Wt 161.0 lb

## 2024-04-27 DIAGNOSIS — E782 Mixed hyperlipidemia: Secondary | ICD-10-CM | POA: Diagnosis not present

## 2024-04-27 DIAGNOSIS — Z0000 Encounter for general adult medical examination without abnormal findings: Secondary | ICD-10-CM | POA: Diagnosis not present

## 2024-04-27 DIAGNOSIS — E538 Deficiency of other specified B group vitamins: Secondary | ICD-10-CM

## 2024-04-27 LAB — LIPID PANEL
Cholesterol: 207 mg/dL — ABNORMAL HIGH (ref 0–200)
HDL: 33.3 mg/dL — ABNORMAL LOW (ref >39.00)
LDL Cholesterol: 133 mg/dL — ABNORMAL HIGH (ref 0–99)
NonHDL: 173.46
Total CHOL/HDL Ratio: 6
Triglycerides: 202 mg/dL — ABNORMAL HIGH (ref 0.0–149.0)
VLDL: 40.4 mg/dL — ABNORMAL HIGH (ref 0.0–40.0)

## 2024-04-27 LAB — COMPREHENSIVE METABOLIC PANEL WITH GFR
ALT: 12 U/L (ref 0–35)
AST: 14 U/L (ref 0–37)
Albumin: 4.5 g/dL (ref 3.5–5.2)
Alkaline Phosphatase: 56 U/L (ref 39–117)
BUN: 7 mg/dL (ref 6–23)
CO2: 25 meq/L (ref 19–32)
Calcium: 9.3 mg/dL (ref 8.4–10.5)
Chloride: 105 meq/L (ref 96–112)
Creatinine, Ser: 0.7 mg/dL (ref 0.40–1.20)
GFR: 102.91 mL/min (ref >60.00)
Glucose, Bld: 79 mg/dL (ref 70–99)
Potassium: 3.7 meq/L (ref 3.5–5.1)
Sodium: 141 meq/L (ref 135–145)
Total Bilirubin: 0.4 mg/dL (ref 0.2–1.2)
Total Protein: 7.5 g/dL (ref 6.0–8.3)

## 2024-04-27 LAB — CBC
HCT: 42.4 % (ref 36.0–46.0)
Hemoglobin: 14.5 g/dL (ref 12.0–15.0)
MCHC: 34.2 g/dL (ref 30.0–36.0)
MCV: 88.8 fl (ref 78.0–100.0)
Platelets: 353 K/uL (ref 150.0–400.0)
RBC: 4.77 Mil/uL (ref 3.87–5.11)
RDW: 13.3 % (ref 11.5–15.5)
WBC: 8.1 K/uL (ref 4.0–10.5)

## 2024-04-27 LAB — VITAMIN B12: Vitamin B-12: 774 pg/mL (ref 211–911)

## 2024-04-27 NOTE — Progress Notes (Signed)
   Subjective:   Patient ID: Sydney Ware, female    DOB: 10/09/1975, 48 y.o.   MRN: [id number]  The patient is here for physical. Pertinent topics discussed: Discussed the use of AI scribe software for clinical note transcription with the patient, who gave verbal consent to proceed.  History of Present Illness Sydney Ware is a 48 year old female  She had shingles in December, which was mild and resolved with treatment. The rash was localized to one side of her body and was not very itchy or painful. She received medication promptly, which helped manage the symptoms.   She experiences tingling in her foot, which she had previously about a year or two ago. At that time, she was advised it might be due to low B12 levels. She started taking B12 gummies, which seemed to resolve the issue. Her B12 levels were checked last year. She is not consistent with taking the gummies daily.  She had two moles removed last month by a dermatologist, which were atypical but not cancerous. She continues to monitor her skin and uses sunscreen.   PMH, [facility], social history reviewed and updated  Review of Systems  Constitutional: Negative.   HENT: Negative.    Eyes: Negative.   Respiratory:  Negative for cough, chest tightness and shortness of breath.   Cardiovascular:  Negative for chest pain, palpitations and leg swelling.  Gastrointestinal:  Negative for abdominal distention, abdominal pain, constipation, diarrhea, nausea and vomiting.  Musculoskeletal: Negative.   Skin: Negative.   Neurological: Negative.   Psychiatric/Behavioral: Negative.      Objective:  Physical Exam Constitutional:      Appearance: She is well-developed.  HENT:     Head: Normocephalic and atraumatic.  Cardiovascular:     Rate and Rhythm: Normal rate and regular rhythm.  Pulmonary:     Effort: Pulmonary effort is normal. No respiratory distress.     Breath sounds: Normal breath sounds. No wheezing or rales.  Abdominal:      General: Bowel sounds are normal. There is no distension.     Palpations: Abdomen is soft.     Tenderness: There is no abdominal tenderness. There is no rebound.  Musculoskeletal:     Cervical back: Normal range of motion.  Skin:    General: Skin is warm and dry.  Neurological:     Mental Status: She is alert and oriented to person, place, and time.     Coordination: Coordination normal.     Vitals:   04/27/24 1036 04/27/24 1119  BP: (!) 142/94 122/72  Pulse: 82   Temp: 99.2 F (37.3 C)   TempSrc: Oral   SpO2: 96%   Weight: 161 lb (73 kg)   Height: 5' 5 (1.651 m)     Assessment & Plan:

## 2024-04-27 NOTE — Assessment & Plan Note (Signed)
 Flu shot yearly. Tetanus up to date. Cologuard up to date. Mammogram up to date, pap smear up to date. Counseled about sun safety and mole surveillance. Counseled about the dangers of distracted driving. Given 10 year screening recommendations.

## 2024-04-27 NOTE — Assessment & Plan Note (Signed)
Checking B12 and adjust as needed. 

## 2024-04-27 NOTE — Assessment & Plan Note (Signed)
 Checking lipid panel and adjust as needed.

## 2024-04-28 ENCOUNTER — Ambulatory Visit: Payer: Self-pay | Admitting: Internal Medicine
